# Patient Record
Sex: Female | Born: 1975 | Race: Black or African American | Hispanic: No | Marital: Single | Smoking: Never smoker
Health system: Southern US, Community
[De-identification: ages and names within clinical notes are randomized; demographics above are authoritative.]

## PROBLEM LIST (undated history)

## (undated) DIAGNOSIS — R7303 Prediabetes: Secondary | ICD-10-CM

## (undated) DIAGNOSIS — I10 Essential (primary) hypertension: Secondary | ICD-10-CM

## (undated) DIAGNOSIS — B009 Herpesviral infection, unspecified: Secondary | ICD-10-CM

## (undated) DIAGNOSIS — E559 Vitamin D deficiency, unspecified: Secondary | ICD-10-CM

## (undated) HISTORY — DX: Vitamin D deficiency, unspecified: E55.9

## (undated) HISTORY — DX: Prediabetes: R73.03

## (undated) HISTORY — PX: WISDOM TOOTH EXTRACTION: SHX21

## (undated) HISTORY — DX: Herpesviral infection, unspecified: B00.9

## (undated) HISTORY — DX: Essential (primary) hypertension: I10

---

## 1998-04-18 ENCOUNTER — Encounter: Payer: Self-pay | Admitting: Emergency Medicine

## 1998-04-18 ENCOUNTER — Emergency Department (HOSPITAL_COMMUNITY): Admission: EM | Admit: 1998-04-18 | Discharge: 1998-04-18 | Payer: Self-pay | Admitting: Emergency Medicine

## 1998-12-25 ENCOUNTER — Other Ambulatory Visit: Admission: RE | Admit: 1998-12-25 | Discharge: 1998-12-25 | Payer: Self-pay | Admitting: Family Medicine

## 1998-12-31 ENCOUNTER — Ambulatory Visit (HOSPITAL_COMMUNITY): Admission: RE | Admit: 1998-12-31 | Discharge: 1998-12-31 | Payer: Self-pay | Admitting: Family Medicine

## 1998-12-31 ENCOUNTER — Encounter: Payer: Self-pay | Admitting: Family Medicine

## 2000-04-14 ENCOUNTER — Other Ambulatory Visit: Admission: RE | Admit: 2000-04-14 | Discharge: 2000-04-14 | Payer: Self-pay | Admitting: Obstetrics & Gynecology

## 2000-10-24 ENCOUNTER — Inpatient Hospital Stay (HOSPITAL_COMMUNITY): Admission: AD | Admit: 2000-10-24 | Discharge: 2000-10-24 | Payer: Self-pay | Admitting: Obstetrics and Gynecology

## 2000-10-25 ENCOUNTER — Encounter (HOSPITAL_COMMUNITY): Admission: RE | Admit: 2000-10-25 | Discharge: 2000-11-08 | Payer: Self-pay | Admitting: Obstetrics and Gynecology

## 2000-10-27 ENCOUNTER — Observation Stay (HOSPITAL_COMMUNITY): Admission: AD | Admit: 2000-10-27 | Discharge: 2000-10-28 | Payer: Self-pay | Admitting: Obstetrics and Gynecology

## 2000-11-07 ENCOUNTER — Encounter (INDEPENDENT_AMBULATORY_CARE_PROVIDER_SITE_OTHER): Payer: Self-pay

## 2000-11-07 ENCOUNTER — Inpatient Hospital Stay (HOSPITAL_COMMUNITY): Admission: AD | Admit: 2000-11-07 | Discharge: 2000-11-10 | Payer: Self-pay | Admitting: Obstetrics and Gynecology

## 2000-11-11 ENCOUNTER — Encounter: Admission: RE | Admit: 2000-11-11 | Discharge: 2000-12-11 | Payer: Self-pay | Admitting: Obstetrics and Gynecology

## 2001-08-28 ENCOUNTER — Other Ambulatory Visit: Admission: RE | Admit: 2001-08-28 | Discharge: 2001-08-28 | Payer: Self-pay | Admitting: Obstetrics and Gynecology

## 2003-02-06 ENCOUNTER — Other Ambulatory Visit: Admission: RE | Admit: 2003-02-06 | Discharge: 2003-02-06 | Payer: Self-pay | Admitting: Obstetrics and Gynecology

## 2003-02-06 ENCOUNTER — Other Ambulatory Visit: Admission: RE | Admit: 2003-02-06 | Discharge: 2003-02-06 | Payer: Self-pay | Admitting: *Deleted

## 2004-04-02 ENCOUNTER — Other Ambulatory Visit: Admission: RE | Admit: 2004-04-02 | Discharge: 2004-04-02 | Payer: Self-pay | Admitting: Obstetrics and Gynecology

## 2005-04-06 ENCOUNTER — Other Ambulatory Visit: Admission: RE | Admit: 2005-04-06 | Discharge: 2005-04-06 | Payer: Self-pay | Admitting: Obstetrics and Gynecology

## 2006-04-21 ENCOUNTER — Other Ambulatory Visit: Admission: RE | Admit: 2006-04-21 | Discharge: 2006-04-21 | Payer: Self-pay | Admitting: Obstetrics and Gynecology

## 2007-09-20 ENCOUNTER — Emergency Department (HOSPITAL_COMMUNITY): Admission: EM | Admit: 2007-09-20 | Discharge: 2007-09-20 | Payer: Self-pay | Admitting: Emergency Medicine

## 2009-12-29 ENCOUNTER — Inpatient Hospital Stay (HOSPITAL_COMMUNITY): Admission: EM | Admit: 2009-12-29 | Discharge: 2010-01-02 | Payer: Self-pay | Admitting: Emergency Medicine

## 2010-09-20 LAB — URINALYSIS, ROUTINE W REFLEX MICROSCOPIC
Ketones, ur: >80 mg/dL — AB
Protein, ur: 100 mg/dL — AB
Urobilinogen, UA: 0.2 mg/dL (ref 0.0–1.0)

## 2010-09-20 LAB — CBC
HCT: 32.2 % — ABNORMAL LOW (ref 36.0–46.0)
HCT: 33.7 % — ABNORMAL LOW (ref 36.0–46.0)
Hemoglobin: 10.9 g/dL — ABNORMAL LOW (ref 12.0–15.0)
Hemoglobin: 11.5 g/dL — ABNORMAL LOW (ref 12.0–15.0)
MCH: 27.1 pg (ref 26.0–34.0)
MCH: 27.5 pg (ref 26.0–34.0)
MCHC: 33.2 g/dL (ref 30.0–36.0)
MCHC: 33.9 g/dL (ref 30.0–36.0)
MCV: 80.6 fL (ref 78.0–100.0)
MCV: 81.5 fL (ref 78.0–100.0)
Platelets: 244 10*3/uL (ref 150–400)
Platelets: 247 10*3/uL (ref 150–400)
RBC: 3.95 MIL/uL (ref 3.87–5.11)
RBC: 3.98 MIL/uL (ref 3.87–5.11)
RBC: 4.19 MIL/uL (ref 3.87–5.11)
RBC: 5.21 MIL/uL — ABNORMAL HIGH (ref 3.87–5.11)
RDW: 15.8 % — ABNORMAL HIGH (ref 11.5–15.5)
WBC: 13.1 10*3/uL — ABNORMAL HIGH (ref 4.0–10.5)
WBC: 13.2 10*3/uL — ABNORMAL HIGH (ref 4.0–10.5)

## 2010-09-20 LAB — BASIC METABOLIC PANEL
BUN: 2 mg/dL — ABNORMAL LOW (ref 6–23)
BUN: 7 mg/dL (ref 6–23)
CO2: 23 mEq/L (ref 19–32)
Calcium: 7 mg/dL — ABNORMAL LOW (ref 8.4–10.5)
Calcium: 7.9 mg/dL — ABNORMAL LOW (ref 8.4–10.5)
Chloride: 101 mEq/L (ref 96–112)
Chloride: 105 mEq/L (ref 96–112)
Chloride: 107 mEq/L (ref 96–112)
Creatinine, Ser: 0.86 mg/dL (ref 0.4–1.2)
GFR calc Af Amer: >60 mL/min (ref >60)
GFR calc Af Amer: >60 mL/min (ref >60)
GFR calc non Af Amer: >60 mL/min (ref >60)
GFR calc non Af Amer: >60 mL/min (ref >60)
GFR calc non Af Amer: >60 mL/min (ref >60)
Glucose, Bld: 105 mg/dL — ABNORMAL HIGH (ref 70–99)
Glucose, Bld: 95 mg/dL (ref 70–99)
Potassium: 3.1 mEq/L — ABNORMAL LOW (ref 3.5–5.1)
Potassium: 3.4 mEq/L — ABNORMAL LOW (ref 3.5–5.1)
Sodium: 134 mEq/L — ABNORMAL LOW (ref 135–145)
Sodium: 143 mEq/L (ref 135–145)

## 2010-09-20 LAB — COMPREHENSIVE METABOLIC PANEL
ALT: 12 U/L (ref 0–35)
CO2: 24 mEq/L (ref 19–32)
Calcium: 7.2 mg/dL — ABNORMAL LOW (ref 8.4–10.5)
Chloride: 104 mEq/L (ref 96–112)
GFR calc non Af Amer: >60 mL/min (ref >60)
Glucose, Bld: 112 mg/dL — ABNORMAL HIGH (ref 70–99)
Sodium: 137 mEq/L (ref 135–145)
Total Bilirubin: 0.4 mg/dL (ref 0.3–1.2)

## 2010-09-20 LAB — PROTIME-INR
INR: 1.01 (ref 0.00–1.49)
Prothrombin Time: 13.2 seconds (ref 11.6–15.2)

## 2010-09-20 LAB — OVA AND PARASITE EXAMINATION

## 2010-09-20 LAB — POCT I-STAT, CHEM 8
Calcium, Ion: 1.07 mmol/L — ABNORMAL LOW (ref 1.12–1.32)
Glucose, Bld: 122 mg/dL — ABNORMAL HIGH (ref 70–99)
HCT: 45 % (ref 36.0–46.0)
Hemoglobin: 15.3 g/dL — ABNORMAL HIGH (ref 12.0–15.0)
TCO2: 24 mmol/L (ref 0–100)

## 2010-09-20 LAB — HEPATIC FUNCTION PANEL
Albumin: 3.1 g/dL — ABNORMAL LOW (ref 3.5–5.2)
Total Protein: 6.9 g/dL (ref 6.0–8.3)

## 2010-09-20 LAB — CLOSTRIDIUM DIFFICILE EIA: C difficile Toxins A+B, EIA: NEGATIVE

## 2010-09-20 LAB — STOOL CULTURE

## 2010-09-20 LAB — MAGNESIUM: Magnesium: 1.5 mg/dL (ref 1.5–2.5)

## 2010-09-20 LAB — DIFFERENTIAL
Basophils Relative: 0 % (ref 0–1)
Eosinophils Absolute: 0 10*3/uL (ref 0.0–0.7)
Lymphs Abs: 1.4 10*3/uL (ref 0.7–4.0)
Neutro Abs: 6.1 10*3/uL (ref 1.7–7.7)
Neutrophils Relative %: 74 % (ref 43–77)

## 2010-09-20 LAB — URINE MICROSCOPIC-ADD ON

## 2010-09-20 LAB — GLUCOSE, CAPILLARY: Glucose-Capillary: 93 mg/dL (ref 70–99)

## 2010-09-20 LAB — FECAL LACTOFERRIN, QUANT: Fecal Lactoferrin: POSITIVE

## 2010-09-20 LAB — HEMOCCULT GUIAC POC 1CARD (OFFICE): Fecal Occult Bld: POSITIVE

## 2010-09-20 LAB — LACTATE DEHYDROGENASE: LDH: 140 U/L (ref 94–250)

## 2010-09-20 LAB — CULTURE, BLOOD (ROUTINE X 2)

## 2010-09-20 LAB — GIARDIA/CRYPTOSPORIDIUM SCREEN(EIA)

## 2010-11-20 NOTE — H&P (Signed)
[facility]  Patient:    Regina Jennings, Regina Jennings                    MRN: [id number] Adm. Date:  [id number] Attending:  Shaune Spittle Dictator:   Wynelle Bourgeois, CNM                         History and Physical  HISTORY OF PRESENT ILLNESS:   This is a 35 year old G1, P0 at 38-3/7 weeks, who presents from the office for induction of labor secondary to IUGR.  Her pregnancy has been remarkable for (1) late transfer of care from another practice, (2) mild anemia, (3) ASCUS Pap, (4) IUGR.  PRENATAL LABORATORIES:        Hemoglobin 13.9, platelets 314, blood type B-positive, antibody screen negative, sickle cell negative, thalassemia negative, RPR nonreactive, rubella immune, HBsAg negative, HIV nonreactive, Pap test ASCUS, gonorrhea negative, chlamydia negative, AFP free beta within normal limits normal, glucose challenge within normal limits.  MEDICAL HISTORY:              Remarkable for history of abnormal Pap and history of chlamydia 7-8 years ago.  She also has a history of childhood varicella, questionable hepatitis B vaccine, history of anemia.  FAMILY HISTORY:               Remarkable for a maternal grandmother with hypertension, a maternal grandmother with stroke and mother with migraines.  GENETIC HISTORY:              Remarkable for a maternal aunt and uncle with twins.  SOCIAL HISTORY:               Patient is single, but involved with Regina Jennings, Regina Jennings., who is involved and supportive.  She does not report a religious affiliation.  She denies any alcohol, tobacco or drug use.  PHYSICAL EXAMINATION:  VITAL SIGNS:                  Stable.  HEENT:                        Within normal limits.  Thyroid normal, not                               enlarged.  CHEST:                        Clear to auscultation bilaterally.  CARDIOVASCULAR:               Regular rate and rhythm.  ABDOMEN:                      Gravid, vertex to [place].  Her  fetal monitor denotes fetal heart rate baseline 130-140 with small accelerations, occasional mild uterine contractions.  CERVICAL EXAMINATION:         Closed, 50% effaced and -1 to -2 station with a vertex presentation.  EXTREMITIES:                  Within normal limits.  ASSESSMENT:                   1. Intrauterine pregnancy at 38-3/7 weeks.  2. Intrauterine growth retardation.  PLAN:                         1. Admit to birthing suite per Dr. Pennie Rushing.                               2. Routine M.D. orders.                               3. Induction planned per Dr. Pennie Rushing. DD:  10/27/00 TD:  10/27/00 Job: [id number]

## 2010-11-20 NOTE — Discharge Summary (Signed)
[facility]  Patient:    Regina Jennings, Regina Jennings                    MRN: [id number] Adm. Date:  [id number] Disc. Date: [id number] Attending:  Shaune Spittle Dictator:   Vance Gather Duplantis, C.N.M.                           Discharge Summary  ADMISSION DIAGNOSES:          1. Intrauterine pregnancy at 38-3/7 weeks.                               2. Intrauterine growth restriction.                               3. Unfavorable cervix.  DISCHARGE DIAGNOSES:          1. Intrauterine pregnancy at 38-3/7 weeks.                               2. Intrauterine growth restriction.                               3. Unfavorable cervix.                               4. Failed induction of labor.                               5. Reassuring fetal heart rate tracing.  PROCEDURES THIS ADMISSION:               None.  HOSPITAL COURSE:              Ms. Storti is a 35 year old single, black female, gravida 1, para 0 at 38-3/7 weeks who was admitted for induction of labor secondary to IUGR of less than 3rd percentile and some mild variable-type decelerations noted on NST in the office earlier today on April 25. She was admitted for cervical ripening on the evening of April 25 and was started on Pitocin on April 26. She continued on Pitocin throughout the day of April 26, and contracted but never changed her cervix, and was offered the options of Cytotec and then Pitocin again tonight, a C-section, or discharge to home. The risks and benefits of each option were reviewed by Dr. Stefano Gaul with the patient and her friend, and the patient elected to go home. She is instructed to continue with kick counts and to return on October 31, 2000 for NST.  DISCHARGE INSTRUCTIONS:       The patient is to do fetal kick counts regularly, to call if she notices decreased fetal movement, and any other signs and symptoms of labor.  DISCHARGE LABORATORIES:       None.  DISCHARGE FOLLOWUP:            Followup will be on October 31, 2000 for another NST.  DISCHARGE MEDICATIONS:        The patient is to continue on prenatal vitamins daily. DD:  10/28/00 TD:  10/29/00 Job: [id number]

## 2012-01-15 IMAGING — CT CT ABD-PELV W/ CM
2 of 4 series · 17 of 46 positions shown, 19 images · IV contrast (omnipaque)
Comparison: Plain films 12/29/2009

CLINICAL DATA: Abdominal pain, blood in stool.

CT ABDOMEN AND PELVIS WITH CONTRAST
TECHNIQUE: Multidetector CT imaging of the abdomen and pelvis was
performed following the standard protocol during bolus
administration of intravenous contrast.
Contrast: 90 ml Omnipaque 300 IV.

[Series 2: routine abdomen · axial · 0.64mm/px · z∈[-407,-72]mm · 14 of 75 slices shown, 16 images]
[im 4/75  soft-tissue]
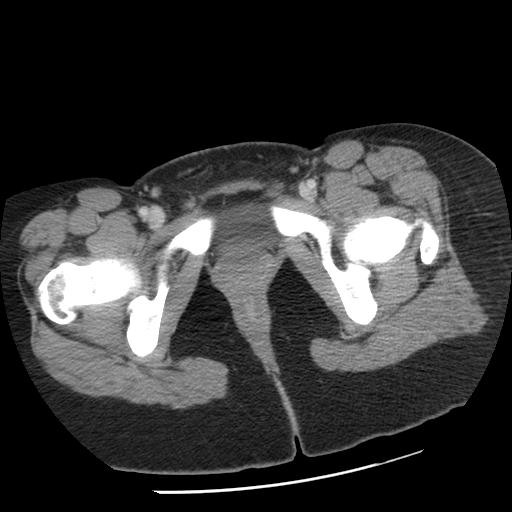
[im 4/75  bone]
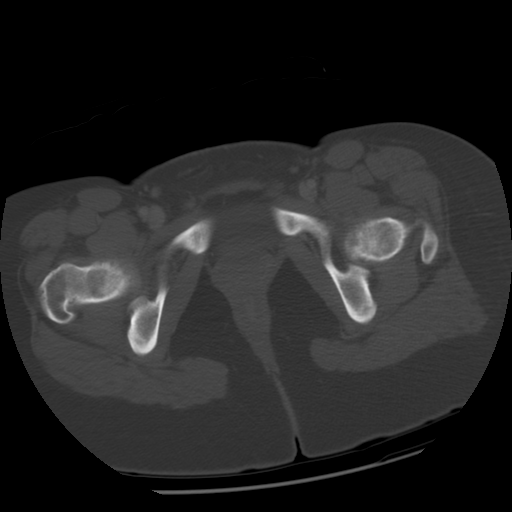
[im 10/75  soft-tissue]
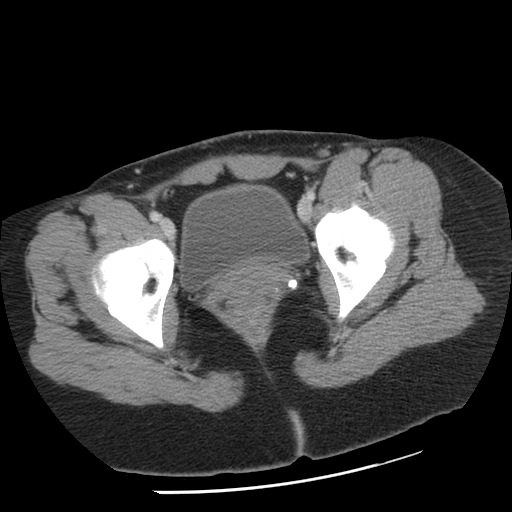
[im 16/75  soft-tissue]
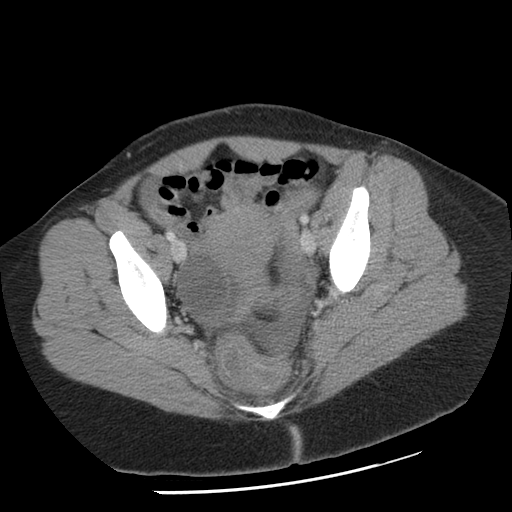
[im 19/75  soft-tissue]
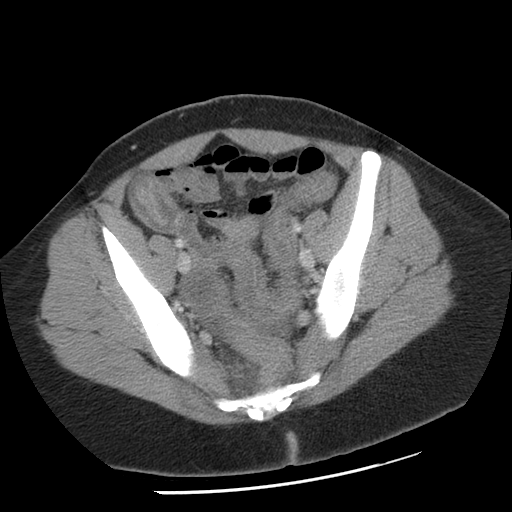
[im 25/75  soft-tissue]
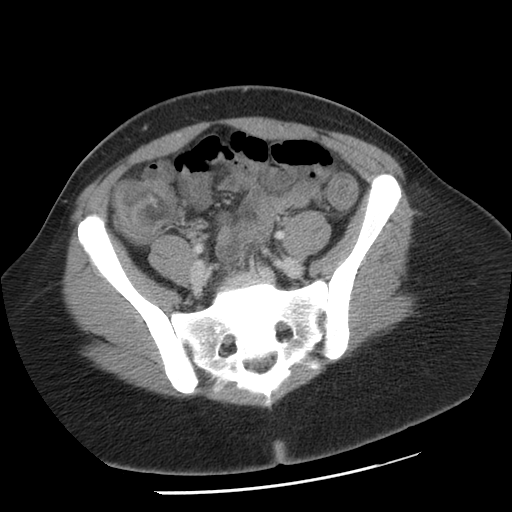
[im 31/75  soft-tissue]
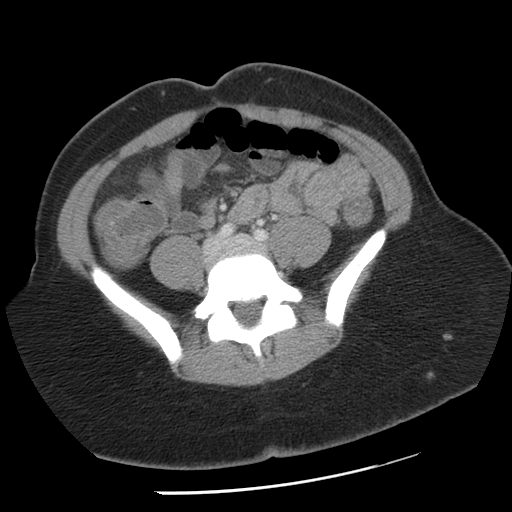
[im 34/75  soft-tissue]
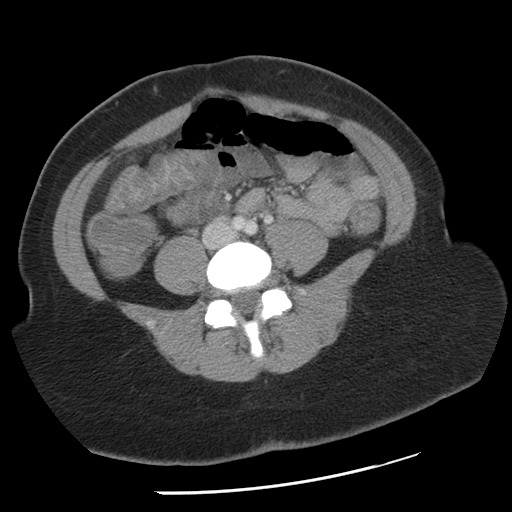
[im 41/75  soft-tissue]
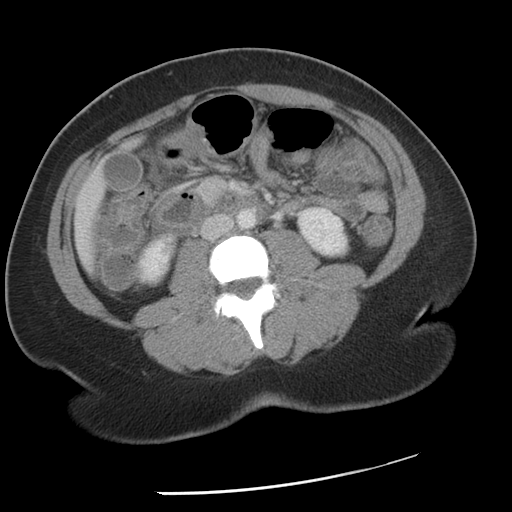
[im 44/75  soft-tissue]
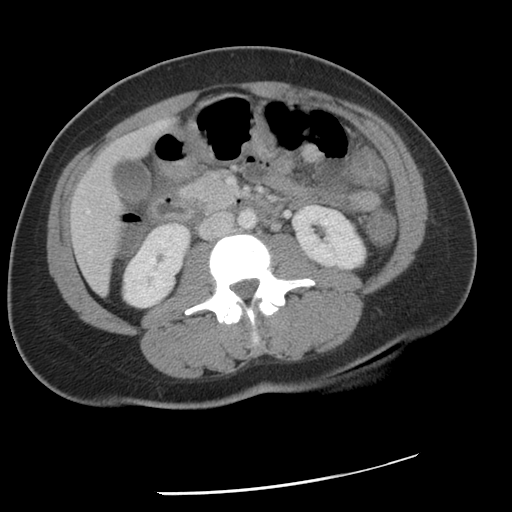
[im 44/75  bone]
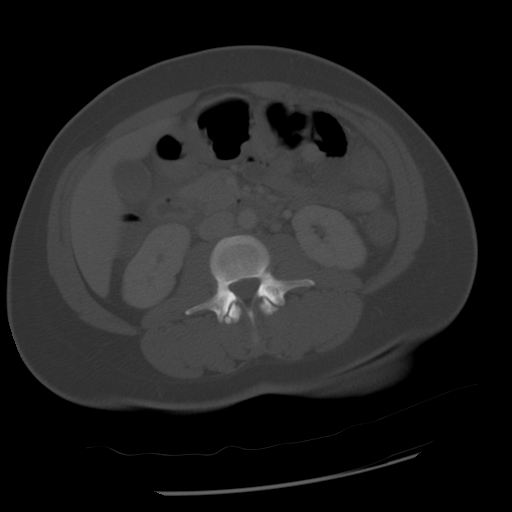
[im 50/75  soft-tissue]
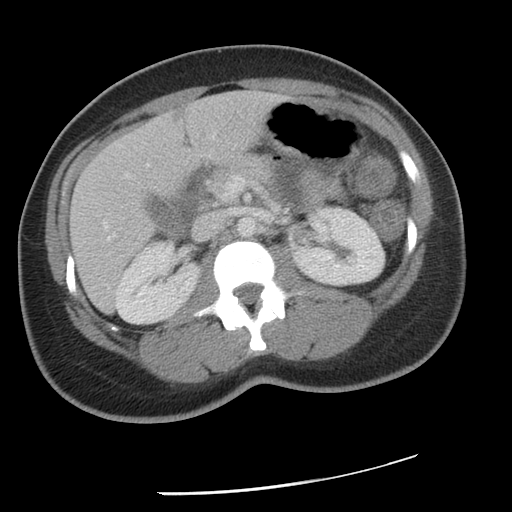
[im 56/75  soft-tissue]
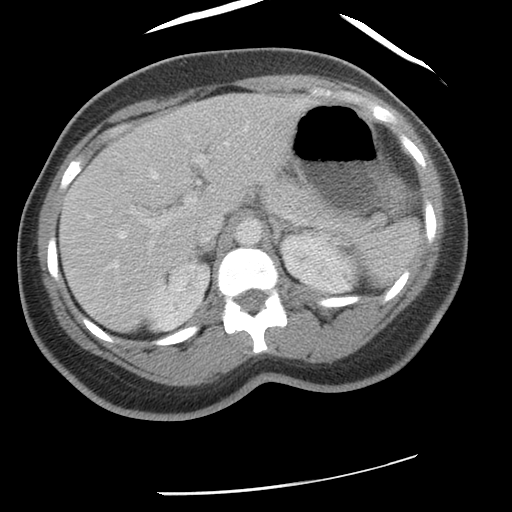
[im 59/75  soft-tissue]
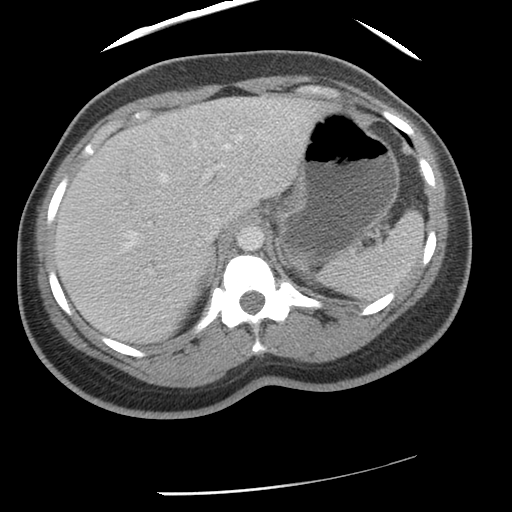
[im 65/75  soft-tissue]
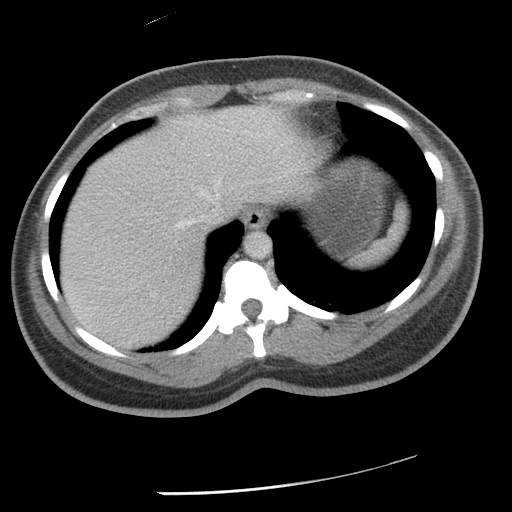
[im 71/75  soft-tissue]
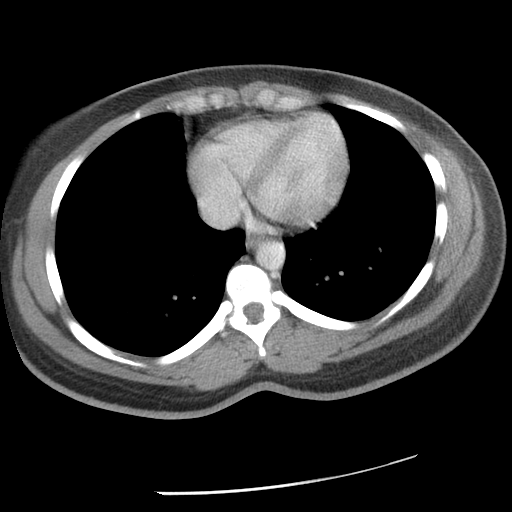

[Series 401: cor · coronal · 0.83mm/px · 3 of 103 slices shown]
[im 35/103  soft-tissue]
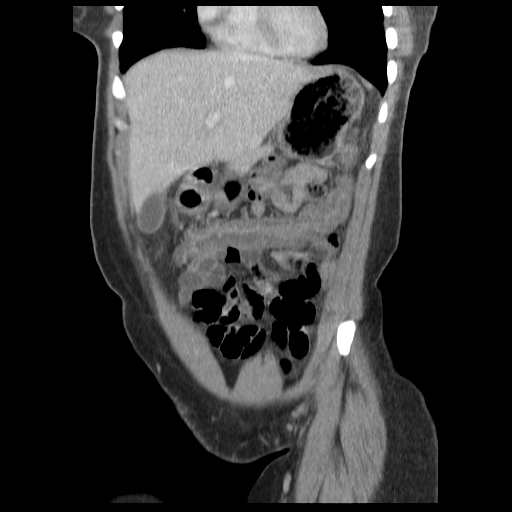
[im 46/103  soft-tissue]
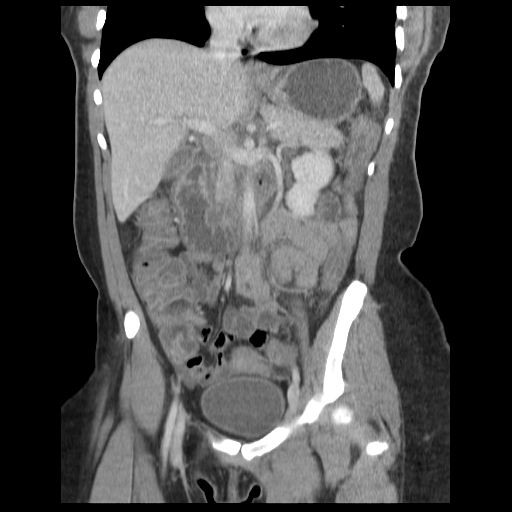
[im 57/103  soft-tissue]
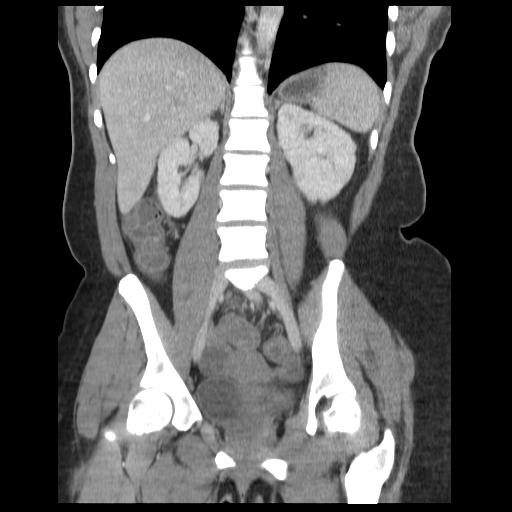

[17 of 46 positions shown; findings below may reference images not displayed]

FINDINGS: Lung bases are clear.  No effusions.  Heart is normal
size.

Liver, spleen, pancreas, gallbladder, adrenals and kidneys are
unremarkable.

There is diffuse colonic wall thickening with surrounding edema and
small amount of free fluid in the pelvis.  In addition, distal
stomach and proximal duodenum are thickened and edematous.  Small
bowel is decompressed.  No evidence of obstruction.

Uterus and left ovary are unremarkable.  3.5 cm cyst noted in the
right ovary.  Aorta is normal caliber.
IMPRESSION: Diffuse colonic wall thickening compatible with pancolitis.

Mild wall thickening in the distal stomach and proximal duodenum
compatible with gastritis/duodenitis.

3.5 cm right ovarian cyst.

Small amount of free fluid the pelvis.

## 2012-05-24 ENCOUNTER — Telehealth: Payer: Self-pay | Admitting: Obstetrics and Gynecology

## 2012-05-24 NOTE — Telephone Encounter (Signed)
TC TO PT REGARDING MESSAGE. PT STATES THAT SHE IS HAVING VAGINAL ITCHING AND WANT TO EVAL. PT STATES SHE IS HAVING WHITE SPOTS ON LEGS AND WANT TO KNOW WHAT THEY ARE.SCHEDULED PT AN APPT FOR 05/26/12 TO SEE EP. PT VOICED UNDERSTANDING.

## 2012-05-26 ENCOUNTER — Encounter: Payer: Self-pay | Admitting: Obstetrics and Gynecology

## 2012-05-26 ENCOUNTER — Ambulatory Visit (INDEPENDENT_AMBULATORY_CARE_PROVIDER_SITE_OTHER): Payer: BC Managed Care – PPO | Admitting: Obstetrics and Gynecology

## 2012-05-26 VITALS — BP 140/88 | Temp 98.8°F | Wt 176.0 lb

## 2012-05-26 DIAGNOSIS — Z309 Encounter for contraceptive management, unspecified: Secondary | ICD-10-CM

## 2012-05-26 DIAGNOSIS — R102 Pelvic and perineal pain: Secondary | ICD-10-CM

## 2012-05-26 DIAGNOSIS — L293 Anogenital pruritus, unspecified: Secondary | ICD-10-CM

## 2012-05-26 DIAGNOSIS — N949 Unspecified condition associated with female genital organs and menstrual cycle: Secondary | ICD-10-CM

## 2012-05-26 DIAGNOSIS — R21 Rash and other nonspecific skin eruption: Secondary | ICD-10-CM

## 2012-05-26 DIAGNOSIS — N898 Other specified noninflammatory disorders of vagina: Secondary | ICD-10-CM

## 2012-05-26 LAB — POCT URINALYSIS DIPSTICK
Blood, UA: NEGATIVE
Nitrite, UA: NEGATIVE
Spec Grav, UA: <=1.005
Whiff Test: NEGATIVE
pH: 7

## 2012-05-26 LAB — POCT URINE PREGNANCY: Preg Test, Ur: NEGATIVE

## 2012-05-26 MED ORDER — FLUCONAZOLE 150 MG PO TABS: 150.0000 mg | ORAL_TABLET | Freq: Once | ORAL | Status: AC

## 2012-05-26 MED ORDER — CLOTRIMAZOLE-BETAMETHASONE 1-0.05 % EX CREA: TOPICAL_CREAM | Freq: Two times a day (BID) | CUTANEOUS | Status: DC

## 2012-05-26 NOTE — Progress Notes (Signed)
36 YO complaining of pelvic pain, vaginal itching and white spots on her legs.  She refuses exam and is unable to urinate.  Will only evaluate white spots on legs. States that one spot has been present for years without any change but has noticed more over the past several months. Menstrual-like cramping for the past several days but none today (expecting period next week and missed 2 pill earlier this week) has begun to bleed.  Is on Jolessa (3 month continuous pills).  Lastly has been having vaginal itching for the past 2 weeks but has diminished since bleeding has started.  Denies any discharge before she began to bleed. Admits to change in soap/detergent  but not toilet paper, underwear or perineal hygiene. [menses: 7 day flow with pad change 4 times a day, with 5/10 cramps].   O: Thighs:  several random 1 mm depigmented areas on each thigh;  no inflammation or irregularity in shape.  U/A- negative  (able to void prior to leaving visit) UPT-negative  A: Thigh Rash-Depigmentation     Vulvitis Symptoms     Pelvic Cramping ? due to bleeding/missed pills  P: Lotrisone Cream 15 grams topically bid x 7-14 days to affected area no refills       Diflucan 150 mg #1 1 po stat no refills       RTO-as scheduled, prn or if symptoms persist with the understanding that an exam would need to be done  Trystyn Sitts, PA-C

## 2012-05-26 NOTE — Progress Notes (Signed)
Vaginal discharge: frank blood Itching / Burning: yes Fever: no  Symptoms have been present for 9 days. Has used over-the-counter treatment: yes PT USED VAGICAINE Associated symptoms:  Pelvic pain: yes CRAMPING THAT STARTED X 2 DAYS AGO       Dyspareunia: no     Odor:  no  History of STD:  HERPES STD screen:declined  PT ALSO STATES SHE HAVE WHITE SPOTS ON LEGS AND WANT TO KNOW WHAT THEY ARE.

## 2012-06-05 ENCOUNTER — Telehealth: Payer: Self-pay

## 2012-06-05 ENCOUNTER — Other Ambulatory Visit: Payer: Self-pay

## 2012-06-05 ENCOUNTER — Telehealth: Payer: Self-pay | Admitting: Obstetrics and Gynecology

## 2012-06-05 MED ORDER — LEVONORGEST-ETH ESTRAD 91-DAY 0.15-0.03 MG PO TABS: 1.0000 | ORAL_TABLET | Freq: Every day | ORAL | Status: DC

## 2012-06-05 NOTE — Telephone Encounter (Signed)
Advised pt that rx for bc will be called into [facility] [address] with enough refills to last until next annual scheduled 07/11/12 Pt voiced understanding  Regina Jennings, [employer]

## 2012-06-05 NOTE — Telephone Encounter (Signed)
Spoke with pharmacy.  Stated they didn't have any record of refill.  Gave refill that HD did originally over the phone.  ld

## 2012-06-05 NOTE — Telephone Encounter (Signed)
Pt left VM stating that Pharmacy [facility] [address] states that they still have not received [facility] Jolessa.  Called [facility] to verify states that they have received rx Advised pt  Darien Ramus, CMA

## 2012-07-06 ENCOUNTER — Encounter: Payer: Self-pay | Admitting: Obstetrics and Gynecology

## 2012-07-11 ENCOUNTER — Encounter: Payer: Self-pay | Admitting: Obstetrics and Gynecology

## 2012-07-11 ENCOUNTER — Ambulatory Visit (INDEPENDENT_AMBULATORY_CARE_PROVIDER_SITE_OTHER): Payer: BC Managed Care – PPO | Admitting: Obstetrics and Gynecology

## 2012-07-11 VITALS — BP 120/78 | HR 82 | Ht 63.5 in | Wt 174.0 lb

## 2012-07-11 DIAGNOSIS — Z01419 Encounter for gynecological examination (general) (routine) without abnormal findings: Secondary | ICD-10-CM

## 2012-07-11 DIAGNOSIS — Z124 Encounter for screening for malignant neoplasm of cervix: Secondary | ICD-10-CM

## 2012-07-11 DIAGNOSIS — Z113 Encounter for screening for infections with a predominantly sexual mode of transmission: Secondary | ICD-10-CM

## 2012-07-11 MED ORDER — LEVONORGEST-ETH ESTRAD 91-DAY 0.15-0.03 MG PO TABS: 1.0000 | ORAL_TABLET | Freq: Every day | ORAL | Status: DC

## 2012-07-11 NOTE — Patient Instructions (Signed)
To develop good sleep habits:    Go to bed and get up  at the same time each day (even on your days off)  Avoid caffeine, alcohol or nicotine at least 4-6 hours before bedtime  If you haven't fallen asleep within 15 minutes of getting in the bed, get up and do something non-stimulating  until you feel sleepy again,  then return to bed.  Only try to sleep when you are actually sleepy.  Do not watch TV, read, write, play games or talk on the phone in bed.  Only use the bed for sleep and sex  Do not nap or remain  in the bed if you are awake  Do not go to bed too  hungry or  too full   Develop a routine prior to bedtime so that your body will get a signal that bedtime is near  Do not do anything stimulating before bedtime  Make sure that your bedroom is comfortable for sleeping

## 2012-07-11 NOTE — Progress Notes (Signed)
Regular Periods: yes Mammogram: yes  Monthly Breast Ex.: no Exercise: yes  Tetanus < 10 years: no Seatbelts: yes  NI. Bladder Functn.: yes Abuse at home: no  Daily BM's: yes Stressful Work: no  Healthy Diet: yes Sigmoid-Colonoscopy: NO  Calcium: yes Medical problems this year: LOW ENERGY; QUESTION ABOUT MVT AND SLEEP PROBLEMS   LAST ZOX:[id number]  Contraception: JOLESSA  Mammogram:  LONG TIME AGO  PCP: DR. SANDERS  PMH: NO CHANGE  FMH: NO CHANGE  Last Bone Scan: ?  PT IS SINGLE

## 2012-07-11 NOTE — Progress Notes (Signed)
Subjective:    Regina Jennings is a 37 y.o. female, G1P0, who presents for an annual exam. The patient reports fatigue and insomnia.  Has early morning awakening (4 a.m.) for the past 4 weeks (was on vacation).    Menstrual cycle:   LMP: Patient's last menstrual period was 06/03/2012.             Review of Systems Pertinent items are noted in HPI. Denies pelvic pain, urinary tract symptoms, vaginitis symptoms, irregular bleeding, menopausal symptoms, change in bowel habits or rectal bleeding   Objective:    BP 120/78  Pulse 82  Ht 5' 3.5" (1.613 m)  Wt 174 lb (78.926 kg)  BMI 30.34 kg/m2  LMP 06/03/2012     Wt Readings from Last 1 Encounters:  07/11/12 174 lb (78.926 kg)   Body mass index is 30.34 kg/(m^2). General Appearance: Alert, no acute distress HEENT: Grossly normal Neck / Thyroid: Supple, no thyromegaly or cervical adenopathy Lungs: Clear to auscultation bilaterally Back: No CVA tenderness Breast Exam: No masses or nodes.No dimpling, nipple retraction or discharge. Cardiovascular: Regular rate and rhythm.  Gastrointestinal: Soft, non-tender, no masses or organomegaly Pelvic Exam: EGBUS-wnl, vagina-normal rugae, cervix- without lesions or tenderness, uterus appears normal size shape and consistency, adnexae-no masses or tenderness Lymphatic Exam: Non-palpable nodes in neck, clavicular,  axillary, or inguinal regions  Skin: no rashes or abnormalities Extremities: no clubbing cyanosis or edema  Neurologic: grossly normal Psychiatric: Alert and oriented   Assessment:   Routine GYN Exam New Insomnia   Plan:  Sleep Hygiene   F/U with Dr. Allyne Gee  PAP sent  STD testing  RTO 1 year or prn  Regina Jennings,ELMIRAPA-C

## 2012-07-12 LAB — PAP IG, CT-NG, RFX HPV ASCU: GC Probe Amp: NEGATIVE

## 2012-07-12 LAB — RPR

## 2012-07-13 ENCOUNTER — Telehealth: Payer: Self-pay | Admitting: Obstetrics and Gynecology

## 2012-07-17 NOTE — Telephone Encounter (Signed)
LVM for return call  Chayden Garrelts, CMA  

## 2012-08-16 NOTE — Telephone Encounter (Signed)
No return call received from pt. 

## 2013-01-10 ENCOUNTER — Encounter (HOSPITAL_COMMUNITY): Payer: Self-pay | Admitting: Family Medicine

## 2013-01-10 ENCOUNTER — Emergency Department (HOSPITAL_COMMUNITY)
Admission: EM | Admit: 2013-01-10 | Discharge: 2013-01-10 | Disposition: A | Discharge status: Home / Self Care | Payer: BC Managed Care – PPO | Attending: Emergency Medicine | Admitting: Emergency Medicine

## 2013-01-10 ENCOUNTER — Emergency Department (HOSPITAL_COMMUNITY)
Admission: EM | Admit: 2013-01-10 | Discharge: 2013-01-10 | Disposition: A | Discharge status: Home / Self Care | Payer: BC Managed Care – PPO | Source: Home / Self Care | Attending: Family Medicine | Admitting: Family Medicine

## 2013-01-10 ENCOUNTER — Emergency Department (HOSPITAL_COMMUNITY): Payer: BC Managed Care – PPO

## 2013-01-10 ENCOUNTER — Encounter (HOSPITAL_COMMUNITY): Payer: Self-pay | Admitting: Emergency Medicine

## 2013-01-10 DIAGNOSIS — Z882 Allergy status to sulfonamides status: Secondary | ICD-10-CM | POA: Insufficient documentation

## 2013-01-10 DIAGNOSIS — I10 Essential (primary) hypertension: Secondary | ICD-10-CM | POA: Insufficient documentation

## 2013-01-10 DIAGNOSIS — Z8619 Personal history of other infectious and parasitic diseases: Secondary | ICD-10-CM | POA: Insufficient documentation

## 2013-01-10 DIAGNOSIS — R091 Pleurisy: Secondary | ICD-10-CM

## 2013-01-10 DIAGNOSIS — M546 Pain in thoracic spine: Secondary | ICD-10-CM | POA: Insufficient documentation

## 2013-01-10 DIAGNOSIS — Z888 Allergy status to other drugs, medicaments and biological substances status: Secondary | ICD-10-CM | POA: Insufficient documentation

## 2013-01-10 DIAGNOSIS — R071 Chest pain on breathing: Secondary | ICD-10-CM

## 2013-01-10 DIAGNOSIS — J90 Pleural effusion, not elsewhere classified: Secondary | ICD-10-CM | POA: Insufficient documentation

## 2013-01-10 DIAGNOSIS — E559 Vitamin D deficiency, unspecified: Secondary | ICD-10-CM | POA: Insufficient documentation

## 2013-01-10 DIAGNOSIS — Z79899 Other long term (current) drug therapy: Secondary | ICD-10-CM | POA: Insufficient documentation

## 2013-01-10 LAB — CBC
Hemoglobin: 12.5 g/dL (ref 12.0–15.0)
MCH: 26.2 pg (ref 26.0–34.0)
Platelets: 258 10*3/uL (ref 150–400)
RBC: 4.78 MIL/uL (ref 3.87–5.11)
WBC: 6.2 10*3/uL (ref 4.0–10.5)

## 2013-01-10 LAB — BASIC METABOLIC PANEL
CO2: 26 mEq/L (ref 19–32)
Chloride: 103 mEq/L (ref 96–112)
Glucose, Bld: 82 mg/dL (ref 70–99)
Sodium: 138 mEq/L (ref 135–145)

## 2013-01-10 LAB — D-DIMER, QUANTITATIVE: D-Dimer, Quant: <0.27 ug/mL-FEU (ref 0.00–0.48)

## 2013-01-10 LAB — POCT I-STAT TROPONIN I: Troponin i, poc: 0 ng/mL (ref 0.00–0.08)

## 2013-01-10 MED ORDER — IBUPROFEN 800 MG PO TABS: 800.0000 mg | ORAL_TABLET | Freq: Three times a day (TID) | ORAL | Status: DC

## 2013-01-10 MED ORDER — MORPHINE SULFATE 4 MG/ML IJ SOLN
4.0000 mg | Freq: Once | INTRAMUSCULAR | Status: AC
  Administered 2013-01-10: 4 mg via INTRAVENOUS
  Filled 2013-01-10: qty 1

## 2013-01-10 MED ORDER — HYDROCODONE-ACETAMINOPHEN 5-325 MG PO TABS: 1.0000 | ORAL_TABLET | ORAL | Status: DC | PRN

## 2013-01-10 MED ORDER — ONDANSETRON HCL 4 MG/2ML IJ SOLN
4.0000 mg | Freq: Once | INTRAMUSCULAR | Status: AC
  Administered 2013-01-10: 4 mg via INTRAVENOUS
  Filled 2013-01-10: qty 2

## 2013-01-10 NOTE — ED Notes (Signed)
MD at bedside. 

## 2013-01-10 NOTE — ED Notes (Signed)
Pt had EKG done at [facility]

## 2013-01-10 NOTE — ED Notes (Addendum)
Patient is refusing to have EKG done. EDP made aware.

## 2013-01-10 NOTE — ED Notes (Signed)
Per pt sts sharp pain in her left upper back radiating into her chest. sts worse with deep breath. Pt sent here from [facility] for further eval. Denies cough, fever, SOB. Denies injury

## 2013-01-10 NOTE — ED Notes (Signed)
Emily West, PA at the bedside.  

## 2013-01-10 NOTE — ED Provider Notes (Signed)
History    CSN: [id number] Arrival date & time 01/10/13  1001  First MD Initiated Contact with Patient 01/10/13 1025     Chief Complaint  Patient presents with  . Back Pain  . Chest Pain   (Consider location/radiation/quality/duration/timing/severity/associated sxs/prior Treatment) HPI Comments: Patient reports sudden onset of left upper back pain with radiation through her chest that began this morning while at work.  States pain is exacerbated by deep inspiration.  Pain is described as sharp and severe.  Pt took two aleve pills this morning with some improvement.  Denies fevers, chills, SOB, cough, hemoptysis.  No recent illness.  No recent travel or immobilization.  No peripheral edema.  Pt is on oral contraceptives.  No family or personal hx of blood clots.    The history is provided by the patient.   Past Medical History  Diagnosis Date  . 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 Vitamin D deficiency   . Hypertension    Past Surgical History  Procedure Laterality Date  . Wisdom tooth extraction     Family History  Problem Relation Age of Onset  . Stroke Maternal Grandmother   . Migraines Mother   . Ulcers Mother   . Hypertension Mother    History  Substance Use Topics  . Smoking status: Never Smoker   . Smokeless tobacco: Never Used  . Alcohol Use: Yes   OB History   Grav Para Term Preterm Abortions TAB SAB Ect Mult Living   1         1     Review of Systems  Constitutional: Negative for fever and chills.  HENT: Negative for sore throat and trouble swallowing.   Respiratory: Negative for cough and shortness of breath.   Cardiovascular: Positive for chest pain. Negative for palpitations and leg swelling.  Gastrointestinal: Negative for nausea, vomiting, abdominal pain and diarrhea.  Genitourinary: Negative for dysuria, urgency, frequency, vaginal bleeding and vaginal discharge.  Musculoskeletal: Positive for back pain.  Skin: Negative for  rash.  Neurological: Negative for dizziness and light-headedness.    Allergies  Boric acid; Sulfa antibiotics; and Xyzal  Home Medications   Current Outpatient Rx  Name  Route  Sig  Dispense  Refill  . levonorgestrel-ethinyl estradiol (SEASONALE,INTROVALE,JOLESSA) 0.15-0.03 MG tablet   Oral   Take 1 tablet by mouth daily.   1 Package   11   . triamterene-hydrochlorothiazide (DYAZIDE) 37.5-25 MG per capsule   Oral   Take 1 capsule by mouth every morning.          BP 141/86  Pulse 60  Temp(Src) 98.3 F (36.8 C)  Resp 18  SpO2 99%  LMP 11/23/2012 Physical Exam  Nursing note and vitals reviewed. Constitutional: She appears well-developed and well-nourished. No distress.  HENT:  Head: Normocephalic and atraumatic.  Neck: Neck supple.  Cardiovascular: Normal rate and regular rhythm.   Pulmonary/Chest: Effort normal and breath sounds normal. No respiratory distress. She has no wheezes. She has no rales. She exhibits no tenderness.  Abdominal: Soft. She exhibits no distension. There is no tenderness. There is no rebound and no guarding.  Musculoskeletal:  Left upper back nontender.  Neurological: She is alert.  Skin: She is not diaphoretic.    ED Course  Procedures (including critical care time) Labs Reviewed  CBC - Abnormal; Notable for the following:    MCV 76.4 (*)    All other components within normal limits  BASIC METABOLIC  PANEL - Abnormal; Notable for the following:    GFR calc non Af Amer 74 (*)    GFR calc Af Amer 85 (*)    All other components within normal limits  D-DIMER, QUANTITATIVE  POCT I-STAT TROPONIN I   Dg Chest 2 View  01/10/2013   *RADIOLOGY REPORT*  Clinical Data: Back and chest pain.  CHEST - 2 VIEW  Comparison: 09/20/2007.  Findings: No infiltrate, congestive heart failure or pneumothorax. Heart size within normal limits.  Minimal curvature of the thoracic and lumbar spine convex to the left.  IMPRESSION: Minimal curvature of the thoracic and  lumbar spine otherwise negative exam.   Original Report Authenticated By: Lacy Duverney, M.D.   11:26 AM Joint decision making with patient:  Discussed risks of blood clot and possibility of going directly to CT angio chest vs d-dimer.  Pt declines CT due to radiation but agrees to have CT done if d-dimer is positive.    1. Pleurisy     MDM  Pt with sudden onset left upper back pain with radiation into chest with deep inspiration presents to ED from urgent care, concern for PE.  Pt does not have SOB or cough or hemoptysis, has not had swelling of lower extremities. Only risk factor for PE is oral contraceptives.  Pt declined CT angio after discussion.  D-Dimer is negative.  EKG done at urgent care is unremarkable.  Pt with apparent pleurisy, unknown cause.  Pt given information about pleurisy, advised to follow up with PCP. Advised deep inspiration and cough throughout the day.  D/C home with pain medication.  Discussed all results with patient.  Pt given return precautions.  Pt verbalizes understanding and agrees with plan.     Trixie Dredge, PA-C 01/10/13 1428

## 2013-01-10 NOTE — ED Notes (Addendum)
Pt c/o chest pain radiating to her back. Onset this morning. This has happened before. No changes in activity, sudden onset. Hurts to take a deep breath. Took two aleeve about 20 mins ago. Patient is alert and oriented.

## 2013-01-10 NOTE — ED Provider Notes (Signed)
History    CSN: [id number] Arrival date & time 01/10/13  4098  First MD Initiated Contact with Patient 01/10/13 [phone]     Chief Complaint  Patient presents with  . Chest Pain   (Consider location/radiation/quality/duration/timing/severity/associated sxs/prior Treatment) HPI Comments: 37 year old female with history of hypertension. Here complaining of sudden onset of left-sided chest pain that radiates to the back and worsens with deep inspirations. States she fells like her "back and chest tightens in the left side with taking a deep breath or moving". Pain started at work around 8 AM this morning and has been constant since. Not associated with diaphoresis or nausea. No dizziness or syncope. No cough or congestion. No shortness of breath. No fever or chills. Denies recent travel, personal or family history of blood clot disorders. Patient does take oral contraceptives. No leg swelling. Has not taken her blood pressure medication this morning. Denies abdominal pain, vomiting or diarrhea. Denies recent changes in her physical activity. 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 no history of diabetes or cholesterol abnormalities.   Past Medical History  Diagnosis Date  . 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 Vitamin D deficiency   . Hypertension    Past Surgical History  Procedure Laterality Date  . Wisdom tooth extraction     Family History  Problem Relation Age of Onset  . Stroke Maternal Grandmother   . Migraines Mother   . Ulcers Mother   . Hypertension Mother    History  Substance Use Topics  . Smoking status: Never Smoker   . Smokeless tobacco: Never Used  . Alcohol Use: Yes   OB History   Grav Para Term Preterm Abortions TAB SAB Ect Mult Living   1         1     Review of Systems  Constitutional: Negative for fever, chills, diaphoresis, activity change, appetite change and fatigue.  HENT: Negative for congestion and  sore throat.   Respiratory: Positive for chest tightness. Negative for cough, shortness of breath and wheezing.   Cardiovascular: Positive for chest pain. Negative for palpitations and leg swelling.  Gastrointestinal: Negative for nausea, vomiting, abdominal pain and diarrhea.  Musculoskeletal: Positive for back pain.  Skin: Negative for rash.  Neurological: Negative for dizziness and headaches.  All other systems reviewed and are negative.    Allergies  Boric acid; Sulfa antibiotics; and Xyzal  Home Medications   Current Outpatient Rx  Name  Route  Sig  Dispense  Refill  . levonorgestrel-ethinyl estradiol (SEASONALE,INTROVALE,JOLESSA) 0.15-0.03 MG tablet   Oral   Take 1 tablet by mouth daily.   1 Package   11   . triamterene-hydrochlorothiazide (DYAZIDE) 37.5-25 MG per capsule   Oral   Take 1 capsule by mouth every morning.          BP 167/93  Pulse 83  Temp(Src) 98.2 F (36.8 C) (Oral)  Resp 14  SpO2 97%  LMP 11/23/2012 Physical Exam  Nursing note and vitals reviewed. Constitutional: She is oriented to person, place, and time. She appears well-developed and well-nourished.  Uncomfortable with any movement.   HENT:  Head: Normocephalic and atraumatic.  Mouth/Throat: Oropharynx is clear and moist. No oropharyngeal exudate.  Eyes: Conjunctivae and EOM are normal. Pupils are equal, round, and reactive to light. No scleral icterus.  Neck: Neck supple. No JVD present. No thyromegaly present.  Cardiovascular: Normal rate, regular rhythm, normal heart  sounds and intact distal pulses.  Exam reveals no gallop and no friction rub.   No murmur heard. No low extremity edema  Pulmonary/Chest: No respiratory distress. She has no wheezes. She has no rales. She exhibits no tenderness.  Poor effort due to pain. Shallow breathing but impress clear breath sounds bilaterally. No tachypnea or orthopnea.  Abdominal: Soft. She exhibits no distension. There is no tenderness.   Lymphadenopathy:    She has no cervical adenopathy.  Neurological: She is alert and oriented to person, place, and time.  Skin: She is not diaphoretic.    ED Course  Procedures (including critical care time) Labs Reviewed - No data to display No results found. 1. Chest pain on breathing   EKG: Sinus rhythm with a ventricular rate 68 beats per minute flattening of the T waves in most precordial leads. No ST or other acute ischemic changes.  MDM  37 year old female with history of hypertension. Here complaining of sudden onset of left-sided chest pain that radiates to the back and worsens with movement and deep inspirations. Pain started at work around 8 AM this morning and has been constant since. Not associated with diaphoresis or nausea. No dizziness or syncope. On exam: Vital signs mildly hypertensive with a oxygen saturation at 97% otherwise no tachycardic or tachypneic. Patient uncomfortable with movement grabbing her chest. Impress normal heart and breath sounds throughout. No JVD no low extremity edema.  Patient has taken #2 Aleve tablets per her arrival. No medications has been administered at [facility]. No image study has been performed prior transfer. Decided to transfer to the emergency department for further evaluation and management.   Sharin Grave, MD 01/10/13 [phone]

## 2013-01-10 NOTE — ED Notes (Signed)
Pt. Just returned from X-ray 

## 2013-01-14 NOTE — ED Provider Notes (Signed)
Medical screening examination/treatment/procedure(s) were performed by non-physician practitioner and as supervising physician I was immediately available for consultation/collaboration.  Suzy Kugel T Kaniya Trueheart, MD 01/14/13 0835 

## 2014-05-06 ENCOUNTER — Encounter (HOSPITAL_COMMUNITY): Payer: Self-pay | Admitting: Family Medicine

## 2015-01-27 IMAGING — CR DG CHEST 2V
2 series · 2 of 2 positions shown · non-contrast
Comparison: 09/20/2007.

CLINICAL DATA: Back and chest pain.

CHEST - 2 VIEW

[w chest pa]
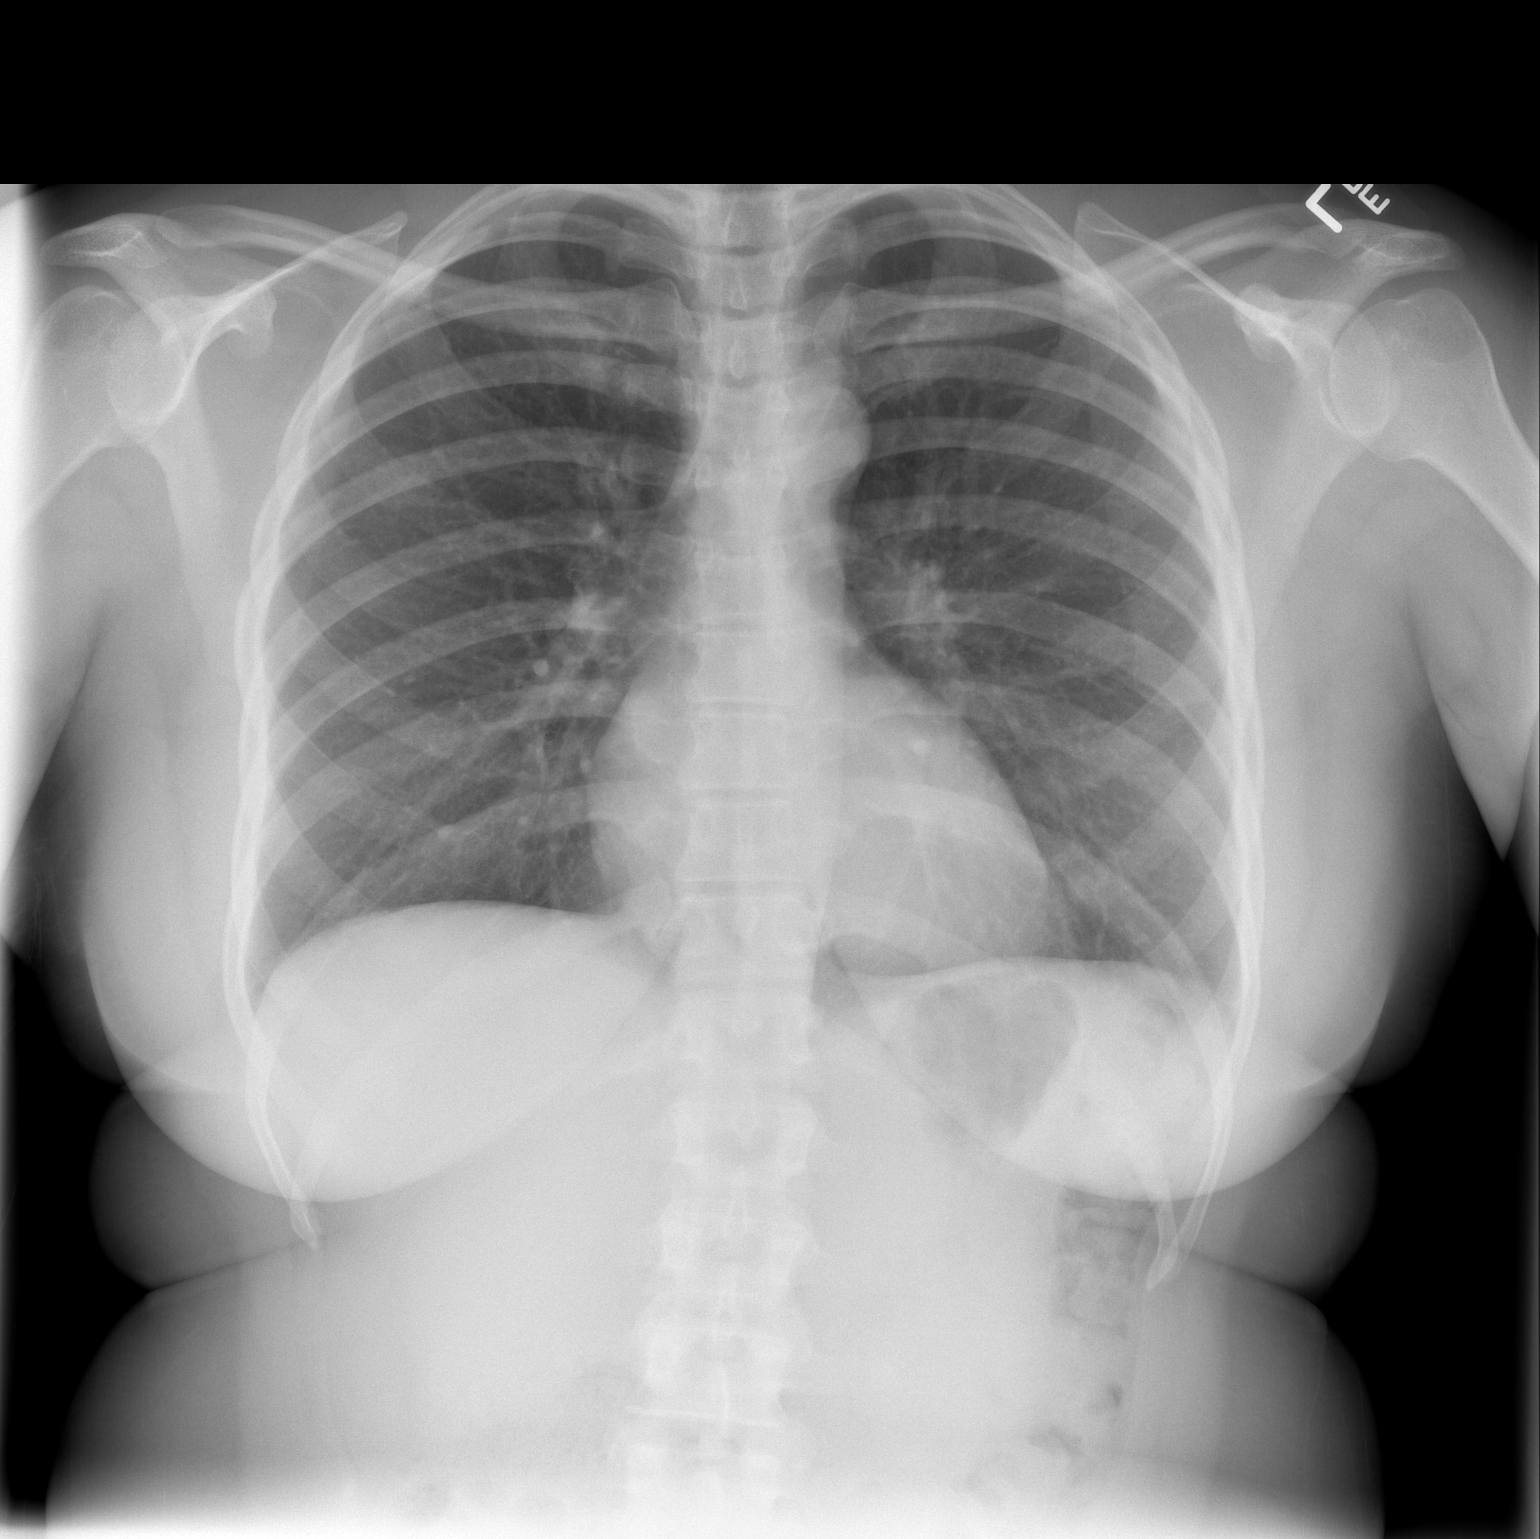

[w chest lat]
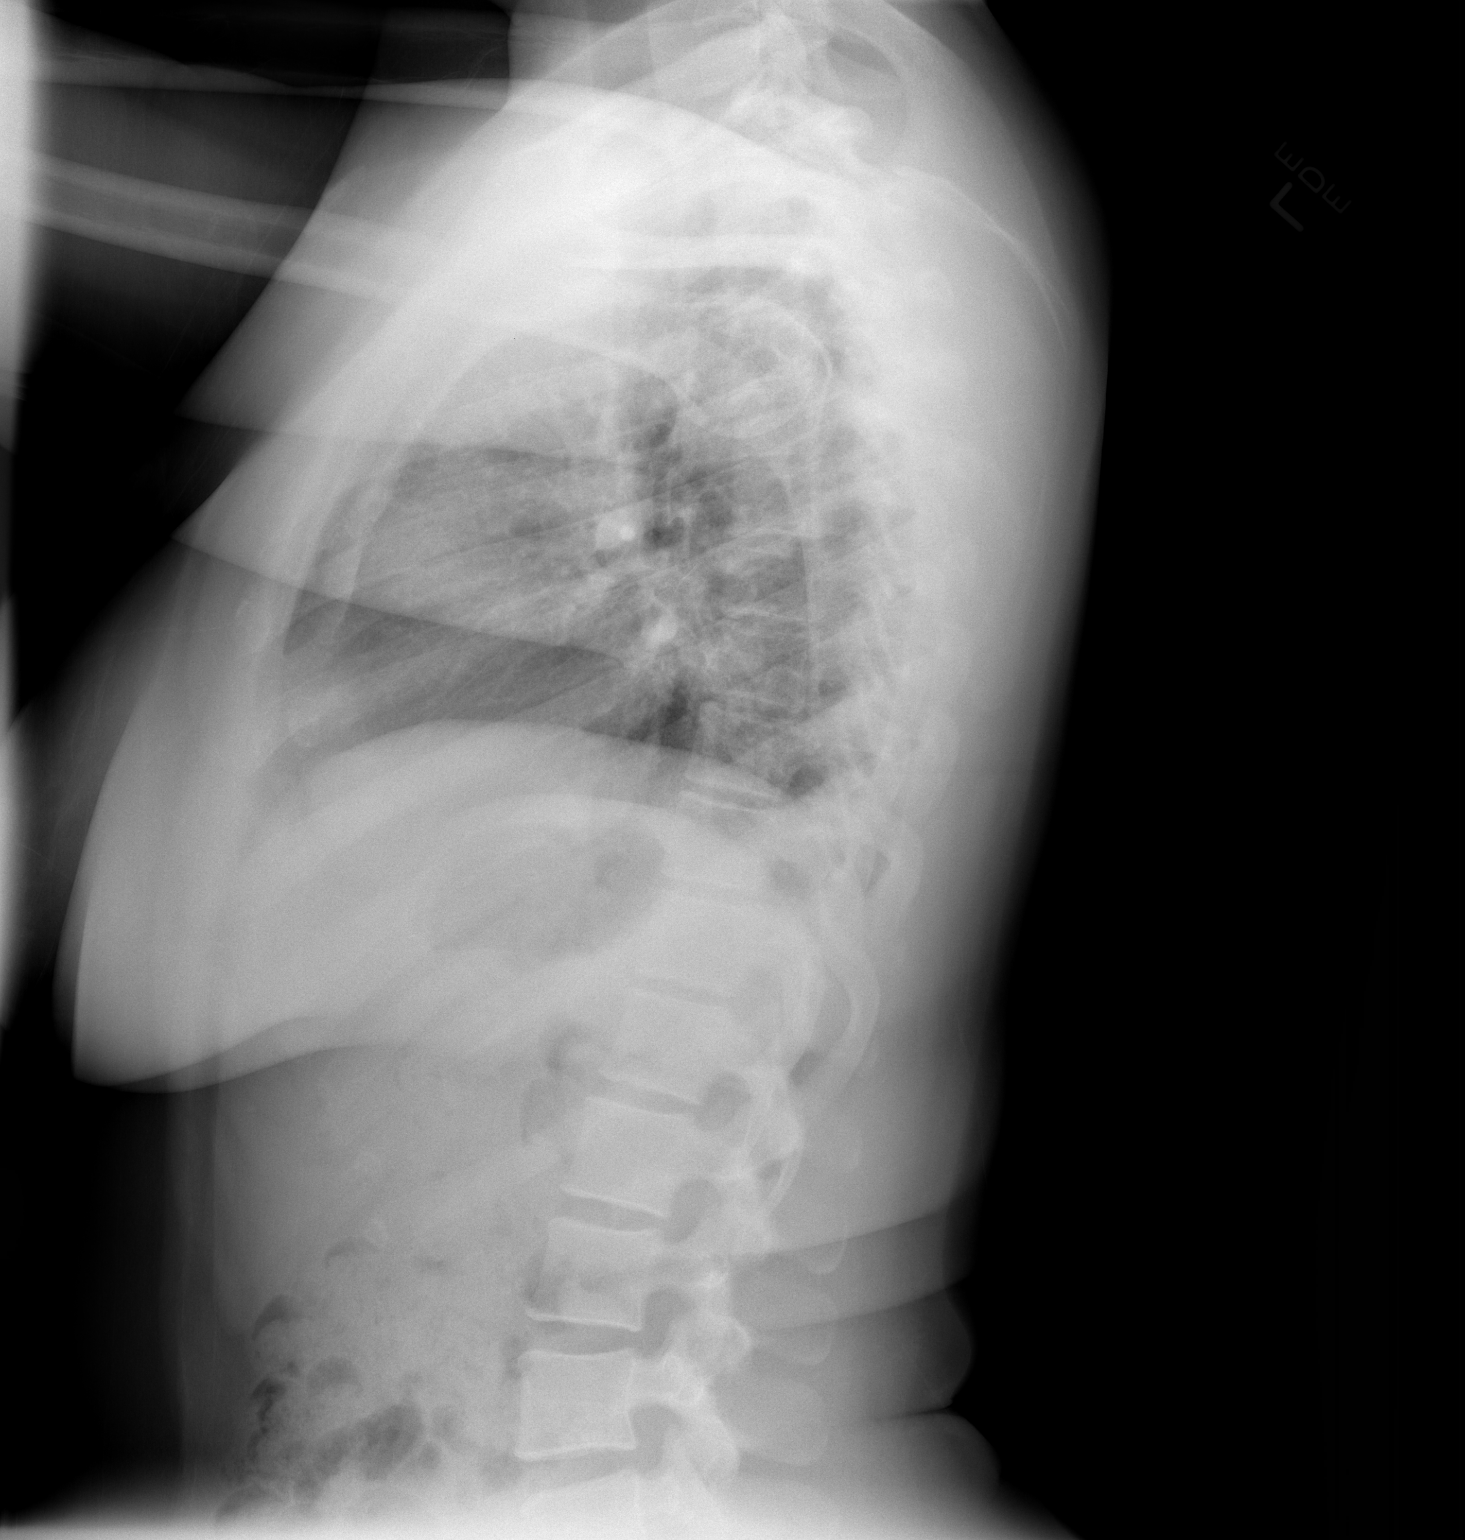

[2 of 2 positions shown; findings below may reference images not displayed]

FINDINGS: No infiltrate, congestive heart failure or pneumothorax..
Heart size within normal limits.  Minimal curvature of the thoracic
and lumbar spine convex to the left.
IMPRESSION: Minimal curvature of the thoracic and lumbar spine otherwise
negative exam.

## 2016-03-19 LAB — GLUCOSE, POCT (MANUAL RESULT ENTRY): POC Glucose: 97 mg/dl (ref 70–99)

## 2018-02-24 ENCOUNTER — Telehealth: Payer: Self-pay | Admitting: Gynecology

## 2018-02-24 NOTE — Telephone Encounter (Signed)
On-call note: Patient calls for birth control pill refill.  On [employer] due to restart this weekend.  One pack called in with instruction for patient to call beginning of this coming week to the office to make sure they want to continue her on the pills and to refill the quantity they choose.  She reports being within 1 year of her annual exam.  She reports having no medical issues other than well-controlled hypertension and never smoked.

## 2018-03-09 ENCOUNTER — Institutional Professional Consult (permissible substitution): Payer: Self-pay | Admitting: Neurology

## 2018-04-05 ENCOUNTER — Other Ambulatory Visit: Payer: Self-pay | Admitting: Internal Medicine

## 2018-04-24 ENCOUNTER — Encounter: Payer: Self-pay | Admitting: Neurology

## 2018-04-27 ENCOUNTER — Institutional Professional Consult (permissible substitution): Payer: Self-pay | Admitting: Neurology

## 2018-05-01 ENCOUNTER — Encounter: Payer: Self-pay | Admitting: Nurse Practitioner

## 2018-05-01 ENCOUNTER — Ambulatory Visit (INDEPENDENT_AMBULATORY_CARE_PROVIDER_SITE_OTHER): Payer: BC Managed Care – PPO | Admitting: Nurse Practitioner

## 2018-05-01 VITALS — BP 110/72 | HR 67 | Temp 98.2°F | Ht 62.5 in | Wt 195.6 lb

## 2018-05-01 DIAGNOSIS — Z23 Encounter for immunization: Secondary | ICD-10-CM | POA: Diagnosis not present

## 2018-05-01 DIAGNOSIS — L219 Seborrheic dermatitis, unspecified: Secondary | ICD-10-CM | POA: Diagnosis not present

## 2018-05-01 MED ORDER — KETOCONAZOLE 2 % EX CREA: TOPICAL_CREAM | Freq: Every day | CUTANEOUS | Status: DC | PRN

## 2018-05-01 NOTE — Progress Notes (Signed)
Subjective:     Patient ID: Regina Jennings , female    DOB: 1976-06-04 , 42 y.o.   MRN: [id number]   Chief Complaint  Patient presents with  . dry scalp    pt states her scalp has been very dry. pt states she tried to make an appointment with the dermatologist that she was previously referred to they gave her an appointment for the 19th.     HPI  She is using multiple natural hair products and has used in the last month.  She admits to not drinking adequate amounts of water. She has an appt with [facility] on November 19th.  Now the problem is interfering with her daily life style and causing her "embarrassment".  Rash  This is a new problem. The current episode started 1 to 4 weeks ago. The problem has been gradually worsening since onset. The affected locations include the scalp, face, left eye, left ear, right eye and right ear. The rash is characterized by scaling and itchiness. Pertinent negatives include no fatigue, fever, joint pain or rhinorrhea. Treatments tried: tetri oil. The treatment provided no relief. There is no history of allergies, asthma or eczema.     Past Medical History:  Diagnosis Date  . 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 Hypertension   . Vitamin D deficiency       Current Outpatient Medications:  .  Cholecalciferol (VITAMIN D) 2000 UNITS tablet, Take 2,000 Units by mouth See admin instructions. Takes 2,000 units every day except Saturday and Sunday, Disp: , Rfl:  .  levonorgestrel-ethinyl estradiol (SEASONALE,INTROVALE,JOLESSA) 0.15-0.03 MG tablet, Take 1 tablet by mouth daily., Disp: 1 Package, Rfl: 11 .  losartan-hydrochlorothiazide (HYZAAR) 50-12.5 MG tablet, TAKE 1 TABLET BY MOUTH EVERY DAY, Disp: 90 tablet, Rfl: 0   Allergies  Allergen Reactions  . Boric Acid Hives  . Flagyl [Metronidazole]   . Sulfa Antibiotics Rash  . Xyzal [Cetirizine Hcl] Rash     Review of Systems  Constitutional: Negative for fatigue  and fever.  HENT: Negative for rhinorrhea.   Eyes: Negative.   Respiratory: Negative.   Cardiovascular: Negative.   Musculoskeletal: Negative.  Negative for joint pain.  Skin: Positive for rash.       Dry scaly scalp with dryness around eyes.   Neurological: Negative.      Today's Vitals   05/01/18 1420  BP: 110/72  Pulse: 67  Temp: 98.2 F (36.8 C)  TempSrc: Oral  SpO2: 97%  Weight: 195 lb 9.6 oz (88.7 kg)  Height: 5' 2.5" (1.588 m)  PainSc: 0-No pain   Body mass index is 35.21 kg/m.   Objective:  Physical Exam  Constitutional: She appears well-developed and well-nourished.  Pulmonary/Chest: Effort normal.  Skin: Skin is warm and dry. Rash noted.           Assessment And Plan:     1. Seborrheic dermatitis of scalp  Hyperpigmented scalp with moderate to severe amount of scaly skin to edges of scalp and throughout  Encouraged to limit the hair products being used and to consult with hair expert for best product to use.  Eucerin cream given for edges and beneath eyes and ears  I will also check for an autoimmune etiology - ketoconazole (NIZORAL) 2 % cream - Autoimmune Profile  2. Need for influenza vaccination  Influenza vaccine administered  Encouraged to take Tylenol as needed for fever or muscle aches. - Flu Vaccine QUAD 6+ mos  PF IM (Fluarix Quad PF)         Arnette Felts, FNP

## 2018-05-01 NOTE — Patient Instructions (Signed)
   Seborrheic Dermatitis, Adult Seborrheic dermatitis is a skin disease that causes red, scaly patches. It usually occurs on the scalp, and it is often called dandruff. The patches may appear on other parts of the body. Skin patches tend to appear where there are many oil glands in the skin. Areas of the body that are commonly affected include:  Scalp.  Skin folds of the body.  Ears.  Eyebrows.  Neck.  Face.  Armpits.  The bearded area of men's faces.  The condition may come and go for no known reason, and it is often long-lasting (chronic). What are the causes? The cause of this condition is not known. What increases the risk? This condition is more likely to develop in people who:  Have certain conditions, such as: ? HIV (human immunodeficiency virus). ? AIDS (acquired immunodeficiency syndrome). ? Parkinson disease. ? Mood disorders, such as depression.  Are 24-39 years old.  What are the signs or symptoms? Symptoms of this condition include:  Thick scales on the scalp.  Redness on the face or in the armpits.  Skin that is flaky. The flakes may be white or yellow.  Skin that seems oily or dry but is not helped with moisturizers.  Itching or burning in the affected areas.  How is this diagnosed? This condition is diagnosed with a medical history and physical exam. A sample of your skin may be tested (skin biopsy). You may need to see a skin specialist (dermatologist). How is this treated? There is no cure for this condition, but treatment can help to manage the symptoms. You may get treatment to remove scales, lower the risk of skin infection, and reduce swelling or itching. Treatment may include:  Creams that reduce swelling and irritation (steroids).  Creams that reduce skin yeast.  Medicated shampoo, soaps, moisturizing creams, or ointments.  Medicated moisturizing creams or ointments.  Follow these instructions at home:  Apply over-the-counter and  prescription medicines only as told by your health care provider.  Use any medicated shampoo, soaps, skin creams, or ointments only as told by your health care provider.  Keep all follow-up visits as told by your health care provider. This is important. Contact a health care provider if:  Your symptoms do not improve with treatment.  Your symptoms get worse.  You have new symptoms. This information is not intended to replace advice given to you by your health care provider. Make sure you discuss any questions you have with your health care provider. Document Released: 06/21/2005 Document Revised: 01/09/2016 Document Reviewed: 10/09/2015 Elsevier Interactive Patient Education  2018 Elsevier Inc[employer]   1. Use warmed mineral oil (NOT HOT) to scalp and massage  2. Use Nizoral shampoo to scalp twice a week. 3. Continue to call Dermatology in the event they have a cancellation 4. Use Eucrisa sample beneath eye (avoid getting in eyes), if effective you can use to outer edges of scalp as well call for a prescription

## 2018-05-02 ENCOUNTER — Telehealth: Payer: Self-pay

## 2018-05-02 LAB — AUTOIMMUNE PROFILE
Anti Nuclear Antibody(ANA): NEGATIVE
COMPLEMENT C3, SERUM: 153 mg/dL (ref 82–167)
dsDNA Ab: 1 IU/mL (ref 0–9)

## 2018-05-02 NOTE — Telephone Encounter (Signed)
Patient called stating she went to the store to pick up the mineral oil as recommended but she got confused when she saw what it was used for she stated it said it was used for constipation.  I returned pt call and advised her I would speak with you about it once you returned to work on tomorrow but she stated it was ok she has an appointment with the dermatologist this week so she is going to wait and see what they say. YRL,RMA

## 2018-05-05 ENCOUNTER — Other Ambulatory Visit: Payer: Self-pay

## 2018-05-05 MED ORDER — KETOCONAZOLE 2 % EX SHAM: MEDICATED_SHAMPOO | CUTANEOUS | 3 refills | Status: DC

## 2018-05-24 ENCOUNTER — Other Ambulatory Visit: Payer: Self-pay | Admitting: Gynecology

## 2018-07-06 ENCOUNTER — Other Ambulatory Visit: Payer: Self-pay

## 2018-07-06 MED ORDER — LOSARTAN POTASSIUM-HCTZ 50-12.5 MG PO TABS: 1.0000 | ORAL_TABLET | Freq: Every day | ORAL | 1 refills | Status: DC

## 2018-07-17 ENCOUNTER — Other Ambulatory Visit: Payer: Self-pay | Admitting: Internal Medicine

## 2018-08-17 ENCOUNTER — Encounter: Payer: Self-pay | Admitting: Internal Medicine

## 2018-08-17 ENCOUNTER — Ambulatory Visit: Payer: BC Managed Care – PPO | Admitting: Internal Medicine

## 2018-08-17 VITALS — BP 120/78 | HR 85 | Temp 98.2°F | Ht 62.5 in | Wt 190.6 lb

## 2018-08-17 DIAGNOSIS — L219 Seborrheic dermatitis, unspecified: Secondary | ICD-10-CM | POA: Diagnosis not present

## 2018-08-17 DIAGNOSIS — I10 Essential (primary) hypertension: Secondary | ICD-10-CM | POA: Insufficient documentation

## 2018-08-17 DIAGNOSIS — R7309 Other abnormal glucose: Secondary | ICD-10-CM | POA: Insufficient documentation

## 2018-08-17 DIAGNOSIS — E6609 Other obesity due to excess calories: Secondary | ICD-10-CM | POA: Diagnosis not present

## 2018-08-17 DIAGNOSIS — Z6834 Body mass index (BMI) 34.0-34.9, adult: Secondary | ICD-10-CM

## 2018-08-17 NOTE — Patient Instructions (Signed)
Seborrheic Dermatitis, Adult  Seborrheic dermatitis is a skin disease that causes red, scaly patches. It usually occurs on the scalp, and it is often called dandruff. The patches may appear on other parts of the body. Skin patches tend to appear where there are many oil glands in the skin. Areas of the body that are commonly affected include:   Scalp.   Skin folds of the body.   Ears.   Eyebrows.   Neck.   Face.   Armpits.   The bearded area of men's faces.  The condition may come and go for no known reason, and it is often long-lasting (chronic).  What are the causes?  The cause of this condition is not known.  What increases the risk?  This condition is more likely to develop in people who:   Have certain conditions, such as:  ? HIV (human immunodeficiency virus).  ? AIDS (acquired immunodeficiency syndrome).  ? Parkinson disease.  ? Mood disorders, such as depression.   Are 40-60 years old.  What are the signs or symptoms?  Symptoms of this condition include:   Thick scales on the scalp.   Redness on the face or in the armpits.   Skin that is flaky. The flakes may be white or yellow.   Skin that seems oily or dry but is not helped with moisturizers.   Itching or burning in the affected areas.  How is this diagnosed?  This condition is diagnosed with a medical history and physical exam. A sample of your skin may be tested (skin biopsy). You may need to see a skin specialist (dermatologist).  How is this treated?  There is no cure for this condition, but treatment can help to manage the symptoms. You may get treatment to remove scales, lower the risk of skin infection, and reduce swelling or itching. Treatment may include:   Creams that reduce swelling and irritation (steroids).   Creams that reduce skin yeast.   Medicated shampoo, soaps, moisturizing creams, or ointments.   Medicated moisturizing creams or ointments.  Follow these instructions at home:   Apply over-the-counter and prescription  medicines only as told by your health care provider.   Use any medicated shampoo, soaps, skin creams, or ointments only as told by your health care provider.   Keep all follow-up visits as told by your health care provider. This is important.  Contact a health care provider if:   Your symptoms do not improve with treatment.   Your symptoms get worse.   You have new symptoms.  This information is not intended to replace advice given to you by your health care provider. Make sure you discuss any questions you have with your health care provider.  Document Released: 06/21/2005 Document Revised: 01/09/2016 Document Reviewed: 10/09/2015  Elsevier Interactive Patient Education  2019 Elsevier Inc.

## 2018-08-18 ENCOUNTER — Other Ambulatory Visit: Payer: Self-pay

## 2018-08-18 LAB — HEMOGLOBIN A1C
Est. average glucose Bld gHb Est-mCnc: 117 mg/dL
Hgb A1c MFr Bld: 5.7 % — ABNORMAL HIGH (ref 4.8–5.6)

## 2018-08-18 LAB — CMP14+EGFR
ALBUMIN: 4.3 g/dL (ref 3.8–4.8)
ALT: 14 IU/L (ref 0–32)
AST: 15 IU/L (ref 0–40)
Albumin/Globulin Ratio: 1.4 (ref 1.2–2.2)
Alkaline Phosphatase: 57 IU/L (ref 39–117)
BILIRUBIN TOTAL: 0.4 mg/dL (ref 0.0–1.2)
BUN / CREAT RATIO: 11 (ref 9–23)
BUN: 12 mg/dL (ref 6–24)
CO2: 25 mmol/L (ref 20–29)
CREATININE: 1.07 mg/dL — AB (ref 0.57–1.00)
Calcium: 9.8 mg/dL (ref 8.7–10.2)
Chloride: 100 mmol/L (ref 96–106)
GFR, EST AFRICAN AMERICAN: 74 mL/min/{1.73_m2} (ref >59)
GFR, EST NON AFRICAN AMERICAN: 64 mL/min/{1.73_m2} (ref >59)
Globulin, Total: 3.1 g/dL (ref 1.5–4.5)
Glucose: 88 mg/dL (ref 65–99)
Potassium: 3.9 mmol/L (ref 3.5–5.2)
Sodium: 139 mmol/L (ref 134–144)
TOTAL PROTEIN: 7.4 g/dL (ref 6.0–8.5)

## 2018-08-22 ENCOUNTER — Telehealth: Payer: Self-pay

## 2018-08-22 ENCOUNTER — Other Ambulatory Visit: Payer: Self-pay

## 2018-08-22 NOTE — Telephone Encounter (Signed)
-----   Message from Dorothyann Peng, MD sent at 08/18/2018 10:50 PM EST ----- Your liver and kidney fxn are stable. Your hba1c is 5.7, this is in predm range.  Increase exercise and avoid sugary beverages. Aim for 30 minutes five days weekly.

## 2018-08-22 NOTE — Telephone Encounter (Signed)
Left the pt a message to call back for her lab results. 

## 2018-08-28 NOTE — Progress Notes (Signed)
Subjective:     Patient ID: Regina Jennings , female    DOB: 04-Apr-1976 , 43 y.o.   MRN: [id number]   Chief Complaint  Patient presents with  . Hypertension    HPI  Hypertension  This is a chronic problem. The current episode started more than 1 year ago. The problem has been gradually improving since onset. The problem is controlled. Pertinent negatives include no blurred vision, chest pain, palpitations or shortness of breath.   She reports compliance with meds.   Past Medical History:  Diagnosis Date  . 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 Hypertension   . Vitamin D deficiency      Family History  Problem Relation Age of Onset  . Stroke Maternal Grandmother   . Migraines Mother   . Ulcers Mother   . Hypertension Mother      Current Outpatient Medications:  .  Cholecalciferol (VITAMIN D) 2000 UNITS tablet, Take 2,000 Units by mouth See admin instructions. Takes 2,000 units every day except Saturday and Sunday, Disp: , Rfl:  .  ketoconazole (NIZORAL) 2 % shampoo, Apply to scalp daily prn, Disp: 120 mL, Rfl: 3 .  levonorgestrel-ethinyl estradiol (SETLAKIN) 0.15-0.03 MG tablet, Setlakin 0.15 mg-30 mcg (91) tablets,3 month dose pack, Disp: , Rfl:  .  losartan-hydrochlorothiazide (HYZAAR) 50-12.5 MG tablet, TAKE 1 TABLET BY MOUTH EVERY DAY, Disp: 90 tablet, Rfl: 1 .  Homeopathic Products (OSCILLOCOCCINUM PO), Take by mouth., Disp: , Rfl:   Current Facility-Administered Medications:  .  ketoconazole (NIZORAL) 2 % cream, , Topical, Daily PRN, Minette Brine, FNP   Allergies  Allergen Reactions  . Boric Acid Hives  . Flagyl [Metronidazole]   . Sulfa Antibiotics Rash  . Xyzal [Cetirizine Hcl] Rash     Review of Systems  Constitutional: Negative.   Eyes: Negative for blurred vision.  Respiratory: Negative.  Negative for shortness of breath.   Cardiovascular: Negative.  Negative for chest pain and palpitations.  Gastrointestinal: Negative.   Skin:        She has seborrheic dermatitis. Has been eval by Derm. She reports she has had some improvement In her sx.   Neurological: Negative.   Psychiatric/Behavioral: Negative.      Today's Vitals   08/17/18 1553  BP: 120/78  Pulse: 85  Temp: 98.2 F (36.8 C)  TempSrc: Oral  Weight: 190 lb 9.6 oz (86.5 kg)  Height: 5' 2.5" (1.588 m)   Body mass index is 34.31 kg/m.   Objective:  Physical Exam Vitals signs and nursing note reviewed.  Constitutional:      Appearance: Normal appearance.  HENT:     Head: Normocephalic and atraumatic.  Cardiovascular:     Rate and Rhythm: Normal rate and regular rhythm.     Heart sounds: Normal heart sounds.  Pulmonary:     Effort: Pulmonary effort is normal.     Breath sounds: Normal breath sounds.  Skin:    General: Skin is warm.  Neurological:     General: No focal deficit present.     Mental Status: She is alert.  Psychiatric:        Mood and Affect: Mood normal.        Behavior: Behavior normal.         Assessment And Plan:     1. Essential hypertension, benign  Well controlled. She will continue with current meds. She is encouraged to avoid adding salt to her foods.   - CMP14+EGFR  2. Seborrheic dermatitis of scalp  Chronic. As per Derm.   3. Other abnormal glucose  HER A1C HAS BEEN ELEVATED IN THE PAST. I WILL CHECK AN A1C, BMET TODAY. SHE WAS ENCOURAGED TO AVOID SUGARY BEVERAGES AND PROCESSED FOODS INCLUDNG BREADS, RICE AND PASTA.  - Hemoglobin A1c  4. Class 1 obesity due to excess calories without serious comorbidity with body mass index (BMI) of 34.0 to 34.9 in adult  Importance of achieving optimal weight to decrease risk of cardiovascular disease and cancers was discussed with the patient in full detail. She is encouraged to start slowly - start with 10 minutes twice daily at least three to four days per week and to gradually build to 30 minutes five days weekly. She was given tips to incorporate more activity into  her daily routine - take stairs when possible, park farther away from her job, grocery stores, etc.   Maximino Greenland, MD

## 2018-09-19 LAB — HM PAP SMEAR: HM Pap smear: NORMAL

## 2018-10-09 ENCOUNTER — Other Ambulatory Visit: Payer: Self-pay | Admitting: Internal Medicine

## 2019-01-06 ENCOUNTER — Other Ambulatory Visit: Payer: Self-pay | Admitting: Internal Medicine

## 2019-01-09 ENCOUNTER — Encounter: Payer: Self-pay | Admitting: Internal Medicine

## 2019-01-09 ENCOUNTER — Other Ambulatory Visit: Payer: Self-pay

## 2019-01-09 LAB — POCT INR: INR: 2.1 — AB (ref 0.9–1.1)

## 2019-01-09 MED ORDER — HYDROCHLOROTHIAZIDE 12.5 MG PO TABS: 12.5000 mg | ORAL_TABLET | Freq: Every day | ORAL | 0 refills | Status: DC

## 2019-01-09 MED ORDER — LOSARTAN POTASSIUM 50 MG PO TABS: 50.0000 mg | ORAL_TABLET | Freq: Every day | ORAL | 0 refills | Status: DC

## 2019-02-27 ENCOUNTER — Encounter: Payer: Self-pay | Admitting: Internal Medicine

## 2019-02-27 ENCOUNTER — Ambulatory Visit: Payer: BC Managed Care – PPO | Admitting: Internal Medicine

## 2019-02-27 ENCOUNTER — Other Ambulatory Visit: Payer: Self-pay

## 2019-02-27 VITALS — BP 136/84 | HR 84 | Temp 98.4°F | Ht 63.4 in | Wt 184.6 lb

## 2019-02-27 DIAGNOSIS — L700 Acne vulgaris: Secondary | ICD-10-CM | POA: Diagnosis not present

## 2019-02-27 DIAGNOSIS — I10 Essential (primary) hypertension: Secondary | ICD-10-CM

## 2019-02-27 DIAGNOSIS — R0683 Snoring: Secondary | ICD-10-CM

## 2019-02-27 DIAGNOSIS — Z0000 Encounter for general adult medical examination without abnormal findings: Secondary | ICD-10-CM | POA: Diagnosis not present

## 2019-02-27 LAB — POCT URINALYSIS DIPSTICK
Bilirubin, UA: NEGATIVE
Glucose, UA: NEGATIVE
Ketones, UA: NEGATIVE
Leukocytes, UA: NEGATIVE
Nitrite, UA: NEGATIVE
Protein, UA: NEGATIVE
Spec Grav, UA: 1.025 (ref 1.010–1.025)
Urobilinogen, UA: 0.2 E.U./dL
pH, UA: 6 (ref 5.0–8.0)

## 2019-02-27 LAB — POCT UA - MICROALBUMIN
Albumin/Creatinine Ratio, Urine, POC: 30
Creatinine, POC: 300 mg/dL
Microalbumin Ur, POC: 10 mg/L

## 2019-02-27 NOTE — Patient Instructions (Signed)
Health Maintenance, Female Adopting a healthy lifestyle and getting preventive care are important in promoting health and wellness. Ask your health care provider about:  The right schedule for you to have regular tests and exams.  Things you can do on your own to prevent diseases and keep yourself healthy. What should I know about diet, weight, and exercise? Eat a healthy diet   Eat a diet that includes plenty of vegetables, fruits, low-fat dairy products, and lean protein.  Do not eat a lot of foods that are high in solid fats, added sugars, or sodium. Maintain a healthy weight Body mass index (BMI) is used to identify weight problems. It estimates body fat based on height and weight. Your health care provider can help determine your BMI and help you achieve or maintain a healthy weight. Get regular exercise Get regular exercise. This is one of the most important things you can do for your health. Most adults should:  Exercise for at least 150 minutes each week. The exercise should increase your heart rate and make you sweat (moderate-intensity exercise).  Do strengthening exercises at least twice a week. This is in addition to the moderate-intensity exercise.  Spend less time sitting. Even light physical activity can be beneficial. Watch cholesterol and blood lipids Have your blood tested for lipids and cholesterol at 43 years of age, then have this test every 5 years. Have your cholesterol levels checked more often if:  Your lipid or cholesterol levels are high.  You are older than 43 years of age.  You are at high risk for heart disease. What should I know about cancer screening? Depending on your health history and family history, you may need to have cancer screening at various ages. This may include screening for:  Breast cancer.  Cervical cancer.  Colorectal cancer.  Skin cancer.  Lung cancer. What should I know about heart disease, diabetes, and high blood  pressure? Blood pressure and heart disease  High blood pressure causes heart disease and increases the risk of stroke. This is more likely to develop in people who have high blood pressure readings, are of African descent, or are overweight.  Have your blood pressure checked: ? Every 3-5 years if you are 18-39 years of age. ? Every year if you are 40 years old or older. Diabetes Have regular diabetes screenings. This checks your fasting blood sugar level. Have the screening done:  Once every three years after age 40 if you are at a normal weight and have a low risk for diabetes.  More often and at a younger age if you are overweight or have a high risk for diabetes. What should I know about preventing infection? Hepatitis B If you have a higher risk for hepatitis B, you should be screened for this virus. Talk with your health care provider to find out if you are at risk for hepatitis B infection. Hepatitis C Testing is recommended for:  Everyone born from 1945 through 1965.  Anyone with known risk factors for hepatitis C. Sexually transmitted infections (STIs)  Get screened for STIs, including gonorrhea and chlamydia, if: ? You are sexually active and are younger than 43 years of age. ? You are older than 43 years of age and your health care provider tells you that you are at risk for this type of infection. ? Your sexual activity has changed since you were last screened, and you are at increased risk for chlamydia or gonorrhea. Ask your health care provider if   you are at risk.  Ask your health care provider about whether you are at high risk for HIV. Your health care provider may recommend a prescription medicine to help prevent HIV infection. If you choose to take medicine to prevent HIV, you should first get tested for HIV. You should then be tested every 3 months for as long as you are taking the medicine. Pregnancy  If you are about to stop having your period (premenopausal) and  you may become pregnant, seek counseling before you get pregnant.  Take 400 to 800 micrograms (mcg) of folic acid every day if you become pregnant.  Ask for birth control (contraception) if you want to prevent pregnancy. Osteoporosis and menopause Osteoporosis is a disease in which the bones lose minerals and strength with aging. This can result in bone fractures. If you are 65 years old or older, or if you are at risk for osteoporosis and fractures, ask your health care provider if you should:  Be screened for bone loss.  Take a calcium or vitamin D supplement to lower your risk of fractures.  Be given hormone replacement therapy (HRT) to treat symptoms of menopause. Follow these instructions at home: Lifestyle  Do not use any products that contain nicotine or tobacco, such as cigarettes, e-cigarettes, and chewing tobacco. If you need help quitting, ask your health care provider.  Do not use street drugs.  Do not share needles.  Ask your health care provider for help if you need support or information about quitting drugs. Alcohol use  Do not drink alcohol if: ? Your health care provider tells you not to drink. ? You are pregnant, may be pregnant, or are planning to become pregnant.  If you drink alcohol: ? Limit how much you use to 0-1 drink a day. ? Limit intake if you are breastfeeding.  Be aware of how much alcohol is in your drink. In the [place] one drink equals one 12 oz bottle of beer (355 mL), one 5 oz glass of wine (148 mL), or one 1 oz glass of hard liquor (44 mL). General instructions  Schedule regular health, dental, and eye exams.  Stay current with your vaccines.  Tell your health care provider if: ? You often feel depressed. ? You have ever been abused or do not feel safe at home. Summary  Adopting a healthy lifestyle and getting preventive care are important in promoting health and wellness.  Follow your health care provider's instructions about healthy  diet, exercising, and getting tested or screened for diseases.  Follow your health care provider's instructions on monitoring your cholesterol and blood pressure. This information is not intended to replace advice given to you by your health care provider. Make sure you discuss any questions you have with your health care provider. Document Released: 01/04/2011 Document Revised: 06/14/2018 Document Reviewed: 06/14/2018 Elsevier Patient Education  2020 [employer]vier Inc.  

## 2019-02-27 NOTE — Progress Notes (Signed)
Subjective:     Patient ID: Regina Jennings , female    DOB: 1975/08/27 , 43 y.o.   MRN: [id number]   Chief Complaint  Patient presents with  . Annual Exam  . Hypertension    HPI  She is here today for a full physical examination.  She is followed by Earnstine Regal, PA for her GYN exams.    Hypertension This is a chronic problem. The current episode started more than 1 year ago. The problem has been gradually improving since onset. The problem is controlled. Pertinent negatives include no blurred vision, palpitations or shortness of breath. Risk factors for coronary artery disease include sedentary lifestyle. Past treatments include angiotensin blockers and diuretics. The current treatment provides moderate improvement. Compliance problems include exercise.      Past Medical History:  Diagnosis Date  . 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 Hypertension   . Vitamin D deficiency      Family History  Problem Relation Age of Onset  . Stroke Maternal Grandmother   . Migraines Mother   . Ulcers Mother   . Hypertension Mother      Current Outpatient Medications:  .  Cholecalciferol (VITAMIN D) 2000 UNITS tablet, Take 1,000 Units by mouth See admin instructions. Takes 2,000 units every day except Saturday and Sunday , Disp: , Rfl:  .  Homeopathic Products (OSCILLOCOCCINUM PO), Take by mouth., Disp: , Rfl:  .  hydrochlorothiazide (HYDRODIURIL) 12.5 MG tablet, Take 1 tablet (12.5 mg total) by mouth daily., Disp: 90 tablet, Rfl: 0 .  ketoconazole (NIZORAL) 2 % shampoo, Apply to scalp daily prn, Disp: 120 mL, Rfl: 3 .  levonorgestrel-ethinyl estradiol (SETLAKIN) 0.15-0.03 MG tablet, Setlakin 0.15 mg-30 mcg (91) tablets,3 month dose pack, Disp: , Rfl:  .  losartan (COZAAR) 50 MG tablet, Take 1 tablet (50 mg total) by mouth daily., Disp: 90 tablet, Rfl: 0   Allergies  Allergen Reactions  . Boric Acid Hives  . Flagyl [Metronidazole]   . Sulfa Antibiotics Rash  .  Xyzal [Cetirizine Hcl] Rash     Review of Systems  Constitutional: Negative.   HENT: Negative.   Eyes: Negative.  Negative for blurred vision.  Respiratory: Negative.  Negative for shortness of breath.        She reports she does snore. She admits that she never went for sleep study.   Cardiovascular: Negative.  Negative for palpitations.  Endocrine: Negative.   Genitourinary: Negative.   Musculoskeletal: Negative.   Skin: Negative.        She has "mole" on her back. Does not itch. Feels like it has gotten larger.   Allergic/Immunologic: Negative.   Neurological: Negative.   Hematological: Negative.   Psychiatric/Behavioral: Negative.      Today's Vitals   02/27/19 0844  BP: 136/84  Pulse: 84  Temp: 98.4 F (36.9 C)  TempSrc: Oral  Weight: 184 lb 9.6 oz (83.7 kg)  Height: 5' 3.4" (1.61 m)   Body mass index is 32.29 kg/m.   Objective:  Physical Exam Vitals signs and nursing note reviewed.  Constitutional:      Appearance: Normal appearance. She is obese.  HENT:     Head: Normocephalic and atraumatic.     Right Ear: Tympanic membrane, ear canal and external ear normal.     Left Ear: Tympanic membrane, ear canal and external ear normal.     Nose: Nose normal.     Mouth/Throat:     Mouth:  Mucous membranes are moist.     Pharynx: Oropharynx is clear.  Eyes:     Extraocular Movements: Extraocular movements intact.     Conjunctiva/sclera: Conjunctivae normal.     Pupils: Pupils are equal, round, and reactive to light.  Neck:     Musculoskeletal: Normal range of motion and neck supple.  Cardiovascular:     Rate and Rhythm: Normal rate and regular rhythm.     Pulses: Normal pulses.     Heart sounds: Normal heart sounds.  Pulmonary:     Effort: Pulmonary effort is normal.     Breath sounds: Normal breath sounds.  Chest:     Breasts: Tanner Score is 5.        Right: Normal. No swelling, bleeding, inverted nipple, mass, nipple discharge or skin change.        Left:  Normal. No swelling, bleeding, inverted nipple, mass, nipple discharge or skin change.  Abdominal:     General: Abdomen is flat. Bowel sounds are normal.     Palpations: Abdomen is soft.  Genitourinary:    Comments: deferred Musculoskeletal: Normal range of motion.  Skin:    General: Skin is warm and dry.          Comments: Comedone present. No surrounding erythema. No vesicular lesions noted.   Neurological:     General: No focal deficit present.     Mental Status: She is alert and oriented to person, place, and time.  Psychiatric:        Mood and Affect: Mood normal.        Behavior: Behavior normal.         Assessment And Plan:     1. Routine general medical examination at health care facility  A full exam was performed.  Importance of monthly self breast exams was discussed with the patient. PATIENT HAS BEEN ADVISED TO GET 30-45 MINUTES REGULAR EXERCISE NO LESS THAN FOUR TO FIVE DAYS PER WEEK - BOTH WEIGHTBEARING EXERCISES AND AEROBIC ARE RECOMMENDED.  SHE WAS ADVISED TO FOLLOW A HEALTHY DIET WITH AT LEAST SIX FRUITS/VEGGIES PER DAY, DECREASE INTAKE OF RED MEAT, AND TO INCREASE FISH INTAKE TO TWO DAYS PER WEEK.  MEATS/FISH SHOULD NOT BE FRIED, BAKED OR BROILED IS PREFERABLE.  I SUGGEST WEARING SPF 50 SUNSCREEN ON EXPOSED PARTS AND ESPECIALLY WHEN IN THE DIRECT SUNLIGHT FOR AN EXTENDED PERIOD OF TIME.  PLEASE AVOID FAST FOOD RESTAURANTS AND INCREASE YOUR WATER INTAKE.  - CMP14+EGFR - CBC - Lipid panel - Hemoglobin A1c  2. Essential hypertension, benign  Chronic, fair control. She is aware optimal bp is less than 130/80.  She is encouraged to incorporate more exercise into his daily routine.  EKG performed, no new changes noted. She will rto in six months for re-evaluation.   - EKG 12-Lead - POCT Urinalysis Dipstick (81002) - POCT UA - Microalbumin  3. Snoring  She agrees to repeat referral to Neuro for formal sleep testing. Risks associated with untreated OSA were  discussed with the patient.  - Ambulatory referral to Neurology  4. Comedone  After obtaining verbal consent, this was removed from her upper back without any complications. It was easy to remove, it came off with use of alcohol swab only. No other similar lesions were observed.    Maximino Greenland, MD    THE PATIENT IS ENCOURAGED TO PRACTICE SOCIAL DISTANCING DUE TO THE COVID-19 PANDEMIC.

## 2019-03-14 ENCOUNTER — Other Ambulatory Visit: Payer: Self-pay

## 2019-03-14 ENCOUNTER — Ambulatory Visit: Payer: BC Managed Care – PPO | Admitting: Neurology

## 2019-03-14 ENCOUNTER — Encounter: Payer: Self-pay | Admitting: Neurology

## 2019-03-14 VITALS — BP 132/92 | HR 58 | Ht 63.5 in | Wt 186.0 lb

## 2019-03-14 DIAGNOSIS — E669 Obesity, unspecified: Secondary | ICD-10-CM

## 2019-03-14 DIAGNOSIS — R351 Nocturia: Secondary | ICD-10-CM

## 2019-03-14 DIAGNOSIS — R0683 Snoring: Secondary | ICD-10-CM | POA: Diagnosis not present

## 2019-03-14 DIAGNOSIS — G4719 Other hypersomnia: Secondary | ICD-10-CM

## 2019-03-14 NOTE — Patient Instructions (Addendum)
Thank you for choosing [facility] for your sleep related care! It was nice to meet you today! I appreciate that you entrust me with your sleep related healthcare concerns. I hope, I was able to address at least some of your concerns today, and that I can help you feel reassured and also get better.    Here is what we discussed today and what we came up with as our plan for you:    Based on your symptoms and your exam I believe you are at risk for obstructive sleep apnea (aka OSA), and I think we should proceed with a sleep study to determine whether you do or do not have OSA and how severe it is.As per your preference, I will order a home sleep test.  Even, if you have mild OSA, I may want you to consider treatment with CPAP, as treatment of even borderline or mild sleep apnea can result and improvement of symptoms such as sleep disruption, daytime sleepiness, nighttime bathroom breaks, restless leg symptoms, improvement of headache syndromes, even improved mood disorder.   Please remember, the long-term risks and ramifications of untreated moderate to severe obstructive sleep apnea are: increased Cardiovascular disease, including congestive heart failure, stroke, difficult to control hypertension, treatment resistant obesity, arrhythmias, especially irregular heartbeat commonly known as A. Fib. (atrial fibrillation); even type 2 diabetes has been linked to untreated OSA.   Sleep apnea can cause disruption of sleep and sleep deprivation in most cases, which, in turn, can cause recurrent headaches, problems with memory, mood, concentration, focus, and vigilance. Most people with untreated sleep apnea report excessive daytime sleepiness, which can affect their ability to drive. Please do not drive if you feel sleepy. Patients with sleep apnea developed difficulty initiating and maintaining sleep (aka insomnia).   Having sleep apnea may increase your risk for other sleep disorders,  including involuntary behaviors sleep such as sleep terrors, sleep talking, sleepwalking.    Having sleep apnea can also increase your risk for restless leg syndrome and leg movements at night.   Please note that untreated obstructive sleep apnea may carry additional perioperative morbidity. Patients with significant obstructive sleep apnea (typically, in the moderate to severe degree) should receive, if possible, perioperative PAP (positive airway pressure) therapy and the surgeons and particularly the anesthesiologists should be informed of the diagnosis and the severity of the sleep disordered breathing.   I will likely see you back after your sleep study to go over the test results and where to go from there. We will call you after your sleep study to advise about the results (most likely, you will hear from [place], my nurse) and to set up an appointment at the time, as necessary.    Our sleep lab administrative assistant will call you to schedule your sleep study and give you further instructions, regarding the check in process for the sleep study, arrival time, what to bring, when you can expect to leave after the study, etc., and to answer any other logistical questions you may have. If you don't hear back from her by about 2 weeks from now, please feel free to call her direct line at [phone] or you can call our general clinic number, or email [name] through My Chart.

## 2019-03-14 NOTE — Progress Notes (Signed)
Subjective:    Patient ID: EDELIN FRYER is a 43 y.o. female.  HPI     Star Age, MD, PhD [facility] [address] [place], [place] [address]  Dear Dr. Baird Cancer,   I saw your patient, Yaret Hush, upon your kind request in my sleep clinic today for initial consultation of her sleep disorder, in particular, concern for underlying obstructive sleep apnea.  The patient is unaccompanied today.  As you know, Ms. Argueta is a 43 year old right-handed woman with an underlying medical history of hypertension, vitamin D deficiency, and obesity, who reports snoring and excessive daytime somnolence.  I reviewed your office note from 02/27/2019. Her Epworth sleepiness score is 15 out of 24, fatigue severity score is 34 out of 63.  She is single and lives with her daughter, she works at [employer], as Chief Strategy Officer, currently works from home and a more variable sleep schedule.  Her daughter is 43 and also attends [employer]. Patient's bedtime is currently around 11, rise time around 8.  She has nocturia once per average night, she denies any recurrent morning headaches, she is not aware of any family history of OSA.  She does not wake up rested and does not get good consolidated sleep, it seems like she consistently wakes up after the first 4 hours and then does not get any good deep sleep afterwards.  She is working on weight loss.  She is a non-smoker and drinks alcohol occasionally, no daily caffeine either. Lately, she has noted the need to take some deeper breaths when she is awake.  She feels like she does not always breathe deeply enough.   Her Past Medical History Is Significant For: Past Medical History:  Diagnosis Date  . 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 Hypertension   . Vitamin D deficiency     Her Past Surgical History Is Significant For: Past Surgical History:  Procedure Laterality Date  . WISDOM TOOTH EXTRACTION       Her Family History Is Significant For: Family History  Problem Relation Age of Onset  . Stroke Maternal Grandmother   . Migraines Mother   . Ulcers Mother   . Hypertension Mother     Her Social History Is Significant For: Social History   Socioeconomic History  . Marital status: Single    Spouse name: Not on file  . Number of children: Not on file  . Years of education: Not on file  . Highest education level: Not on file  Occupational History  . Not on file  Social Needs  . Financial resource strain: Not on file  . Food insecurity    Worry: Not on file    Inability: Not on file  . Transportation needs    Medical: Not on file    Non-medical: Not on file  Tobacco Use  . Smoking status: Never Smoker  . Smokeless tobacco: Never Used  Substance and Sexual Activity  . Alcohol use: Yes  . Drug use: No  . Sexual activity: Yes    Birth control/protection: Pill    Comment: JOLESSA  Lifestyle  . Physical activity    Days per week: Not on file    Minutes per session: Not on file  . Stress: Not on file  Relationships  . Social Herbalist on phone: Not on file    Gets together: Not on file    Attends religious service:  Not on file    Active member of club or organization: Not on file    Attends meetings of clubs or organizations: Not on file    Relationship status: Not on file  Other Topics Concern  . Not on file  Social History Narrative  . Not on file    Her Allergies Are:  Allergies  Allergen Reactions  . Boric Acid Hives  . Flagyl [Metronidazole]   . Sulfa Antibiotics Rash  . Xyzal [Cetirizine Hcl] Rash  :   Her Current Medications Are:  Outpatient Encounter Medications as of 03/14/2019  Medication Sig  . Cholecalciferol (VITAMIN D) 2000 UNITS tablet Take 1,000 Units by mouth See admin instructions. Takes 2,000 units every day except Saturday and Sunday   . hydrochlorothiazide (HYDRODIURIL) 12.5 MG tablet Take 1 tablet (12.5 mg total) by mouth  daily.  Marland Kitchen ketoconazole (NIZORAL) 2 % shampoo Apply to scalp daily prn  . levonorgestrel-ethinyl estradiol (SETLAKIN) 0.15-0.03 MG tablet Setlakin 0.15 mg-30 mcg (91) tablets,3 month dose pack  . losartan (COZAAR) 50 MG tablet Take 1 tablet (50 mg total) by mouth daily.  . [DISCONTINUED] Homeopathic Products (OSCILLOCOCCINUM PO) Take by mouth.   No facility-administered encounter medications on file as of 03/14/2019.   :  Review of Systems:  Out of a complete 14 point review of systems, all are reviewed and negative with the exception of these symptoms as listed below: Review of Systems  Neurological:       Pt presents today to discuss her sleep. Pt has never had a sleep study but does endorse snoring.  Epworth Sleepiness Scale 0= would never doze 1= slight chance of dozing 2= moderate chance of dozing 3= high chance of dozing  Sitting and reading: 3 Watching TV: 2 Sitting inactive in a public place (ex. Theater or meeting): 2 As a passenger in a car for an hour without a break: 3 Lying down to rest in the afternoon: 3 Sitting and talking to someone: 0 Sitting quietly after lunch (no alcohol): 2 In a car, while stopped in traffic: 0 Total: 15     Objective:  Neurological Exam  Physical Exam Physical Examination:   Vitals:   03/14/19 1552  BP: (!) 132/92  Pulse: (!) 58    General Examination: The patient is a very pleasant 43 y.o. female in no acute distress. She appears well-developed and well-nourished and well groomed.   HEENT: Normocephalic, atraumatic, pupils are equal, round and reactive to light. Extraocular tracking is well Preserved, hearing is grossly intact. Face is symmetric with normal facial animation and normal facial sensation. Speech is clear with no dysarthria noted. There is no hypophonia. There is no lip, neck/head, jaw or voice tremor. Neck is supple with full range of passive and active motion. There are no carotid bruits on auscultation. Oropharynx  exam reveals: mild mouth dryness, good dental hygiene and mild airway crowding, due to Small airway entry, Tonsils 1+ bilaterally, uvula normal in size, tongue normal, tongue protrudes centrally in palate elevates symmetrically, Mallampati class II.  Neck circumference is 14 and three-quarter inches.  She has a mild to moderate overbite, nasal inspection reveals somewhat prominent inferior turbinates bilaterally, no septal deviation noted.   Chest: Clear to auscultation without wheezing, rhonchi or crackles noted.  Heart: S1+S2+0, regular and normal without murmurs, rubs or gallops noted.   Abdomen: Soft, non-tender and non-distended with normal bowel sounds appreciated on auscultation.  Extremities: There is no pitting edema in the distal lower extremities bilaterally.  Skin: Warm and dry without trophic changes noted.  Musculoskeletal: exam reveals no obvious joint deformities, tenderness or joint swelling or erythema.   Neurologically:  Mental status: The patient is awake, alert and oriented in all 4 spheres. Her immediate and remote memory, attention, language skills and fund of knowledge are appropriate. There is no evidence of aphasia, agnosia, apraxia or anomia. Speech is clear with normal prosody and enunciation. Thought process is linear. Mood is normal and affect is normal.  Cranial nerves II - XII are as described above under HEENT exam. In addition: shoulder shrug is normal with equal shoulder height noted. Motor exam: Normal bulk, strength and tone is noted. There is no drift, tremor or rebound. Romberg is negative. Fine motor skills and coordination: intact with normal finger taps, normal hand movements, normal rapid alternating patting, normal foot taps and normal foot agility.  Cerebellar testing: No dysmetria or intention tremor on finger to nose testing. Heel to shin is unremarkable bilaterally. There is no truncal or gait ataxia.  Sensory exam: intact to light touchin the upper  and lower extremities.  Gait, station and balance: She stands easily. No veering to one side is noted. No leaning to one side is noted. Posture is age-appropriate and stance is narrow based. Gait shows normal stride length and normal pace. No problems turning are noted. Tandem walk is unremarkable.   Assessment and Plan:  In summary, Chancey C Samara DeistSuddreth is a very pleasant 43 y.o.-year old female with an underlying medical history of hypertension, vitamin D deficiency, and obesity, whose history and physical exam are concerning for obstructive sleep apnea (OSA). I had a long chat with the patient about my findings and the diagnosis of OSA, its prognosis and treatment options. We talked about medical treatments, surgical interventions and non-pharmacological approaches. I explained in particular the risks and ramifications of untreated moderate to severe OSA, especially with respect to developing cardiovascular disease down the Road, including congestive heart failure, difficult to treat hypertension, cardiac arrhythmias, or stroke. Even type 2 diabetes has, in part, been linked to untreated OSA. Symptoms of untreated OSA include daytime sleepiness, memory problems, mood irritability and mood disorder such as depression and anxiety, lack of energy, as well as recurrent headaches, especially morning headaches. We talked about trying to maintain a healthy lifestyle in general, as well as the importance of weight control. I encouraged the patient to eat healthy, exercise daily and keep well hydrated, to keep a scheduled bedtime and wake time routine, to not skip any meals and eat healthy snacks in between meals. I advised the patient not to drive when feeling sleepy. I recommended the following at this time: sleep study. She would be more comfortable with a home sleep test which I ordered at this time but I did explain to her The limitation of a home sleep test. I explained the lab sleep test procedure vs HST to the  patient and also outlined possible surgical and non-surgical treatment options of OSA, including the use of a custom-made dental device (which would require a referral to a specialist dentist or oral surgeon), upper airway surgical options, such as Inspire or UPPP (which would involve a referral to an ENT surgeon).  I also explained the CPAP treatment option to the patient, who indicated that she would be willing to try CPAP if the need arises. I explained the importance of being compliant with PAP treatment, not only for insurance purposes but primarily to improve Her symptoms, and for the patient's  long term health benefit, including to reduce Her cardiovascular risks. I answered all her questions today and the patient was in agreement. I plan to see her back after the sleep study is completed and encouraged her to call with any interim questions, concerns, problems or updates.   Thank you very much for allowing me to participate in the care of this nice patient. If I can be of any further assistance to you please do not hesitate to call me at [phone].  Sincerely,   Huston FoleySaima Chanci Ojala, MD, PhD

## 2019-03-22 ENCOUNTER — Encounter: Payer: Self-pay | Admitting: Internal Medicine

## 2019-03-27 ENCOUNTER — Other Ambulatory Visit: Payer: Self-pay

## 2019-03-27 ENCOUNTER — Encounter: Payer: Self-pay | Admitting: Gynecology

## 2019-03-27 ENCOUNTER — Ambulatory Visit (INDEPENDENT_AMBULATORY_CARE_PROVIDER_SITE_OTHER): Payer: BC Managed Care – PPO

## 2019-03-27 VITALS — BP 124/86 | HR 78 | Temp 98.4°F

## 2019-03-27 DIAGNOSIS — Z23 Encounter for immunization: Secondary | ICD-10-CM

## 2019-03-27 NOTE — Progress Notes (Signed)
Pt here for flu shot today

## 2019-03-28 ENCOUNTER — Ambulatory Visit: Payer: BC Managed Care – PPO | Admitting: Neurology

## 2019-03-28 DIAGNOSIS — R0683 Snoring: Secondary | ICD-10-CM

## 2019-03-28 DIAGNOSIS — G4719 Other hypersomnia: Secondary | ICD-10-CM

## 2019-03-28 DIAGNOSIS — E669 Obesity, unspecified: Secondary | ICD-10-CM

## 2019-03-28 DIAGNOSIS — R351 Nocturia: Secondary | ICD-10-CM

## 2019-03-28 DIAGNOSIS — G4733 Obstructive sleep apnea (adult) (pediatric): Secondary | ICD-10-CM

## 2019-03-28 LAB — CMP14+EGFR
ALT: 13 IU/L (ref 0–32)
AST: 14 IU/L (ref 0–40)
Albumin/Globulin Ratio: 1.5 (ref 1.2–2.2)
Albumin: 4.4 g/dL (ref 3.8–4.8)
Alkaline Phosphatase: 64 IU/L (ref 39–117)
BUN/Creatinine Ratio: 10 (ref 9–23)
BUN: 10 mg/dL (ref 6–24)
Bilirubin Total: 0.4 mg/dL (ref 0.0–1.2)
CO2: 26 mmol/L (ref 20–29)
Calcium: 9.5 mg/dL (ref 8.7–10.2)
Chloride: 101 mmol/L (ref 96–106)
Creatinine, Ser: 1.04 mg/dL — ABNORMAL HIGH (ref 0.57–1.00)
GFR calc Af Amer: 76 mL/min/{1.73_m2} (ref >59)
GFR calc non Af Amer: 66 mL/min/{1.73_m2} (ref >59)
Globulin, Total: 2.9 g/dL (ref 1.5–4.5)
Glucose: 84 mg/dL (ref 65–99)
Potassium: 4.2 mmol/L (ref 3.5–5.2)
Sodium: 140 mmol/L (ref 134–144)
Total Protein: 7.3 g/dL (ref 6.0–8.5)

## 2019-03-28 LAB — LIPID PANEL
Chol/HDL Ratio: 2.7 ratio (ref 0.0–4.4)
Cholesterol, Total: 194 mg/dL (ref 100–199)
HDL: 72 mg/dL (ref >39)
LDL Chol Calc (NIH): 103 mg/dL — ABNORMAL HIGH (ref 0–99)
Triglycerides: 110 mg/dL (ref 0–149)
VLDL Cholesterol Cal: 19 mg/dL (ref 5–40)

## 2019-03-28 LAB — CBC
Hematocrit: 41 % (ref 34.0–46.6)
Hemoglobin: 13.2 g/dL (ref 11.1–15.9)
MCH: 25.5 pg — ABNORMAL LOW (ref 26.6–33.0)
MCHC: 32.2 g/dL (ref 31.5–35.7)
MCV: 79 fL (ref 79–97)
Platelets: 325 10*3/uL (ref 150–450)
RBC: 5.17 x10E6/uL (ref 3.77–5.28)
RDW: 13.4 % (ref 11.7–15.4)
WBC: 6.9 10*3/uL (ref 3.4–10.8)

## 2019-03-28 LAB — HEMOGLOBIN A1C
Est. average glucose Bld gHb Est-mCnc: 114 mg/dL
Hgb A1c MFr Bld: 5.6 % (ref 4.8–5.6)

## 2019-04-05 NOTE — Procedures (Signed)
Patient Information     First Name: Regina Last Name: Jennings ID: [id number]  Birth Date: 03-Dec-1975 Age: 43 Gender: Female  Referring Provider: Dorothyann Peng, MD BMI: 32.8 (W=185 lb, H=5' 3'')  Neck Circ.:  14 '' Epworth:  15/24   Sleep Study Information    Study Date: Mar 28, 2019 S/H/A Version: [id number] / 4.1.1528 / 66  History:    43 year old woman with a history of hypertension, vitamin D deficiency, and obesity, who reports snoring and excessive daytime somnolence.    Summary & Diagnosis:    Sleep disordered breathing (Mild OSA)  Recommendations:     This HST shows borderline obstructive sleep apnea, with an AHI of 6/hour and O2 nadir of 91%. Treatment with positive airway pressure can be considered with autoPAP, if desired by the patient. Treatment options otherwise include weight loss and avoidance of the supine sleep position or a dental device. These different avenues will be discussed with the patient. The patient will be seen in follow up in sleep clinic, if necessary. Please note, that other causes of the patient's symptoms, including circadian rhythm disturbances, an underlying mood disorder, medication effect and/or an underlying medical problem cannot be ruled out based on this test. Clinical correlation is recommended. The patient should be cautioned not to drive, work at heights, or operate dangerous or heavy equipment when tired or sleepy. Review and reiteration of good sleep hygiene measures should be pursued with any patient. The referring provider will be notified of the test results.   I certify that I have reviewed the raw data recording prior to the issuance of this report in accordance with the standards of the American Academy of Sleep Medicine (AASM).  Huston Foley, MD, PhD  [facility]) Diplomat, American Board of Psychiatry and Neurology Diplomat, American Board of Sleep Medicine              Sleep Summary  Oxygen  Saturation Statistics   Start Study Time: End Study Time: Total Recording Time:  11:17:05 PM 8:32:36 AM 9 h, 15 min  Total Sleep Time % REM of Sleep Time:  8 h, 31 min 34.9    Mean: 96 Minimum: 91 Maximum: 100  Mean of Desaturations Nadirs (%):   94  Oxygen Desaturation. %: 4-9 10-20 >20 Total  Events Number Total  11 100.0  0 0.0  0 0.0  11 100.0  Oxygen Saturation: <90 <=88 <85 <80 <70  Duration (minutes): Sleep % 0.0 0.0 0.0 0.0 0.0 0.0 0.0 0.0 0.0 0.0     Respiratory Indices      Total Events REM NREM All Night  pRDI:  60  pAHI:  40 ODI:  11  pAHIc:  0  % CSR: 0.0 13.1 10.9 3.6 0.0 7.9 4.7 1.1 0.0 9.0 6.0 1.7 0.0       Pulse Rate Statistics during Sleep (BPM)      Mean: 68 Minimum: 56 Maximum: 104    Indices are calculated using technically valid sleep time of  6 hrs, 39 min. Central-Indices are calculated using technically valid sleep time of  6  hrs, 37 min. pRDI/pAHI are calculated using oxi desaturations ? 3%  Body Position Statistics  Position Supine Prone Right Left Non-Supine  Sleep (min) 404.0 0.0 48.5 59.0 107.5  Sleep % 79.0 0.0 9.5 11.5 21.0  pRDI 9.2 N/A 8.9 7.4 8.0  pAHI 5.9 N/A 5.9 7.4 6.8  ODI 1.9 N/A 0.0 0.0 0.0  Snoring Statistics Snoring Level (dB) >40 >50 >60 >70 >80 >Threshold (45)  Sleep (min) 209.6 5.2 1.3 0.4 0.0 10.0  Sleep % 41.0 1.0 0.3 0.1 0.0 2.0    Mean: 41 dB Sleep Stages Chart

## 2019-04-05 NOTE — Progress Notes (Signed)
Patient referred by Dr. Baird Cancer, seen by me on 03/14/19, HST on 03/28/19.    Please call and notify the patient that the recent home sleep test showed obstructive sleep apnea. OSA is in the borderline range (AHI 6/h, O2 nadir 91%) and treatment with autoPAP or CPAP is not warranted, but we can try autoPAP, if she would like. Alternatively, she can work on wt reduction and try to sleep off her back or consider a dental device, if she prefers. Please let me know, how she would like to proceed. I would be happy to order autoPAP therapy and we can see her in FU. Star Age, MD, PhD [facility])

## 2019-04-09 ENCOUNTER — Telehealth: Payer: Self-pay

## 2019-04-09 NOTE — Telephone Encounter (Signed)
Noted, thanks!

## 2019-04-09 NOTE — Telephone Encounter (Signed)
I called pt and discussed her sleep study results and recommendations. Pt will avoid sleeping on her back. She will pursue weight loss and discus a dental device with her dentist.  A copy of this sleep study will be sent to Dr. Baird Cancer. Pt verbalized understanding of results and recommendations. Pt had no questions at this time but was encouraged to call back if questions arise.

## 2019-04-09 NOTE — Telephone Encounter (Signed)
-----   Message from Star Age, MD sent at 04/05/2019  6:34 PM EDT ----- Patient referred by Dr. Baird Cancer, seen by me on 03/14/19, HST on 03/28/19.    Please call and notify the patient that the recent home sleep test showed obstructive sleep apnea. OSA is in the borderline range (AHI 6/h, O2 nadir 91%) and treatment with autoPAP or CPAP is not warranted, but we can try autoPAP, if she would like. Alternatively, she can work on wt reduction and try to sleep off her back or consider a dental device, if she prefers. Please let me know, how she would like to proceed. I would be happy to order autoPAP therapy and we can see her in FU. Star Age, MD, PhD [facility])

## 2019-04-24 ENCOUNTER — Telehealth: Payer: Self-pay

## 2019-04-24 NOTE — Telephone Encounter (Signed)
I returned the pt's call and notified her that Dr. Baird Cancer said the pt doesn't need a pneumonia vaccination at this time.

## 2019-07-12 ENCOUNTER — Telehealth: Payer: Self-pay

## 2019-07-12 NOTE — Telephone Encounter (Signed)
Pt LVM for an appt spoke with pt she stated she had been feeling dizzy, light headed, vertigo at times and would like an appt. Pt stated she also had these symptoms back in OCT 2020 and had a telecom visit with a different provider. Pt has an upcoming appt with Dr.Sanders

## 2019-07-18 ENCOUNTER — Encounter: Payer: Self-pay | Admitting: Internal Medicine

## 2019-07-18 ENCOUNTER — Other Ambulatory Visit: Payer: Self-pay

## 2019-07-18 ENCOUNTER — Ambulatory Visit: Payer: BC Managed Care – PPO | Admitting: Internal Medicine

## 2019-07-18 VITALS — Temp 98.2°F | Ht 63.5 in | Wt 190.4 lb

## 2019-07-18 DIAGNOSIS — R42 Dizziness and giddiness: Secondary | ICD-10-CM | POA: Diagnosis not present

## 2019-07-18 DIAGNOSIS — R0981 Nasal congestion: Secondary | ICD-10-CM

## 2019-07-18 DIAGNOSIS — I10 Essential (primary) hypertension: Secondary | ICD-10-CM

## 2019-07-18 DIAGNOSIS — F5101 Primary insomnia: Secondary | ICD-10-CM

## 2019-07-18 MED ORDER — NOREL AD 4-10-325 MG PO TABS: 4.0000 mg | ORAL_TABLET | Freq: Two times a day (BID) | ORAL | 0 refills | Status: DC | PRN

## 2019-07-18 NOTE — Patient Instructions (Signed)
COVID-19 Vaccine Information can be found at: [email or web address] For questions related to vaccine distribution or appointments, please email [email or web address] or call [phone].    

## 2019-07-19 LAB — CMP14+EGFR
ALT: 13 IU/L (ref 0–32)
AST: 17 IU/L (ref 0–40)
Albumin/Globulin Ratio: 1.9 (ref 1.2–2.2)
Albumin: 4.7 g/dL (ref 3.8–4.8)
Alkaline Phosphatase: 58 IU/L (ref 39–117)
BUN/Creatinine Ratio: 13 (ref 9–23)
BUN: 13 mg/dL (ref 6–24)
Bilirubin Total: 0.2 mg/dL (ref 0.0–1.2)
CO2: 24 mmol/L (ref 20–29)
Calcium: 9.4 mg/dL (ref 8.7–10.2)
Chloride: 103 mmol/L (ref 96–106)
Creatinine, Ser: 0.99 mg/dL (ref 0.57–1.00)
GFR calc Af Amer: 81 mL/min/{1.73_m2} (ref >59)
GFR calc non Af Amer: 70 mL/min/{1.73_m2} (ref >59)
Globulin, Total: 2.5 g/dL (ref 1.5–4.5)
Glucose: 82 mg/dL (ref 65–99)
Potassium: 4.4 mmol/L (ref 3.5–5.2)
Sodium: 140 mmol/L (ref 134–144)
Total Protein: 7.2 g/dL (ref 6.0–8.5)

## 2019-07-20 ENCOUNTER — Telehealth: Payer: Self-pay

## 2019-07-20 NOTE — Telephone Encounter (Signed)
Left the patient a message to call back for lab results. 

## 2019-07-20 NOTE — Telephone Encounter (Signed)
-----   Message from Dorothyann Peng, MD sent at 07/19/2019  8:42 PM EST ----- Liver and kidney fxn are nl. How is she feeling?

## 2019-07-21 NOTE — Progress Notes (Signed)
This visit occurred during the SARS-CoV-2 public health emergency.  Safety protocols were in place, including screening questions prior to the visit, additional usage of staff PPE, and extensive cleaning of exam room while observing appropriate contact time as indicated for disinfecting solutions.  Subjective:     Patient ID: Regina Jennings , female    DOB: 1975/10/07 , 44 y.o.   MRN: [id number]   Chief Complaint  Patient presents with  . Dizziness    HPI  She is here today for further evaluation of dizziness. She reports she did e-visit with someone and she was taught how to do the Epley maneuver. She reports this somewhat helped, but she thinks her sx are returning. She denies recent URI, but does admit to sinus congestion.   Dizziness This is a new problem. The current episode started in the past 7 days. The problem occurs intermittently. The problem has been unchanged. Associated symptoms include vertigo. Pertinent negatives include no anorexia, arthralgias, joint swelling or neck pain. The symptoms are aggravated by bending. She has tried lying down for the symptoms.     Past Medical History:  Diagnosis Date  . 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 Hypertension   . Vitamin D deficiency      Family History  Problem Relation Age of Onset  . Stroke Maternal Grandmother   . Migraines Mother   . Ulcers Mother   . Hypertension Mother      Current Outpatient Medications:  .  cholecalciferol (VITAMIN D3) 25 MCG (1000 UNIT) tablet, Take 1,000 Units by mouth See admin instructions. Takes 2,000 units every day except Saturday and Sunday , Disp: , Rfl:  .  hydrochlorothiazide (HYDRODIURIL) 12.5 MG tablet, Take 1 tablet (12.5 mg total) by mouth daily., Disp: 90 tablet, Rfl: 0 .  ketoconazole (NIZORAL) 2 % shampoo, Apply to scalp daily prn, Disp: 120 mL, Rfl: 3 .  levonorgestrel-ethinyl estradiol (SETLAKIN) 0.15-0.03 MG tablet, Setlakin 0.15 mg-30 mcg (91)  tablets,3 month dose pack, Disp: , Rfl:  .  losartan (COZAAR) 50 MG tablet, Take 1 tablet (50 mg total) by mouth daily., Disp: 90 tablet, Rfl: 0 .  Chlorphen-PE-Acetaminophen (NOREL AD) 4-10-325 MG TABS, Take 4-10 mg by mouth 2 (two) times daily as needed., Disp: 20 tablet, Rfl: 0   Allergies  Allergen Reactions  . Boric Acid Hives  . Flagyl [Metronidazole]   . Sulfa Antibiotics Rash  . Xyzal [Cetirizine Hcl] Rash     Review of Systems  Constitutional: Negative.   Respiratory: Negative.   Cardiovascular: Negative.   Gastrointestinal: Negative.  Negative for anorexia.  Musculoskeletal: Negative for arthralgias, joint swelling and neck pain.  Neurological: Positive for dizziness and vertigo.  Psychiatric/Behavioral: Positive for sleep disturbance.       She c/o insomnia. She has difficulty falling and staying asleep. She admits to stress at work. She is not sure what is contributing to her sx.      Today's Vitals   07/18/19 1630  Temp: 98.2 F (36.8 C)  TempSrc: Oral  Weight: 190 lb 6.4 oz (86.4 kg)  Height: 5' 3.5" (1.613 m)   Body mass index is 33.2 kg/m.   Objective:  Physical Exam Vitals and nursing note reviewed.  Constitutional:      Appearance: Normal appearance.  HENT:     Head: Normocephalic and atraumatic.     Right Ear: Tympanic membrane, ear canal and external ear normal. There is no impacted cerumen.  Left Ear: Tympanic membrane, ear canal and external ear normal. There is no impacted cerumen.     Nose:     Comments: Deferred, masked    Mouth/Throat:     Comments: Deferred,masked Cardiovascular:     Rate and Rhythm: Normal rate and regular rhythm.     Heart sounds: Normal heart sounds.  Pulmonary:     Effort: Pulmonary effort is normal.     Breath sounds: Normal breath sounds.  Skin:    General: Skin is warm.  Neurological:     General: No focal deficit present.     Mental Status: She is alert.  Psychiatric:        Mood and Affect: Mood normal.         Behavior: Behavior normal.         Assessment And Plan:     1. Vertigo  Resolving. Orthostatics performed, pt also advised to increase her water intake. She admits that she could probably benefit from this.   2. Sinus congestion  She was given samples of Norel AD one tab to take twice daily as needed. She is encouraged to sign up for MyChart so she can email me over the weekend to let me know how she is doing.   3. Essential hypertension, benign  Chronic, fair control. She will continue with current meds. I will check renal function today.   - CMP14+EGFR  4. Primary insomnia  She has melatonin at home and will start this nightly. Also advised to take magnesium 486m nightly.  She is also encouraged to develop a bedtime routine.   RMaximino Greenland MD    THE PATIENT IS ENCOURAGED TO PRACTICE SOCIAL DISTANCING DUE TO THE COVID-19 PANDEMIC.

## 2019-07-30 ENCOUNTER — Telehealth: Payer: Self-pay

## 2019-07-30 DIAGNOSIS — R42 Dizziness and giddiness: Secondary | ICD-10-CM

## 2019-07-30 NOTE — Telephone Encounter (Signed)
The pt called and said that she wanted to let Dr.Sanders know that she took the Norel AD for 2 days and on the second day she couldn't sleep and was feeling weird during the night so she didn't take anymore and would like to get the referral that was offered if she wasn't feeling better.

## 2019-08-08 ENCOUNTER — Telehealth: Payer: Self-pay

## 2019-08-08 NOTE — Telephone Encounter (Signed)
The pt said that she went to the ENT and that she's still not feeling well, they gave her a hearing test, said that she passed that but she is still having the off balance heavy feeling.  The pt said that it feels like she has brain fog and is affecting her work.

## 2019-08-13 ENCOUNTER — Telehealth: Payer: Self-pay

## 2019-08-13 NOTE — Telephone Encounter (Signed)
The pt was told that Dr. Allyne Gee said from her understanding that the ENT is scheduling additional testing for the pt because the pt called and said that she is still having the foggy feeling in her head.  The pt said that she was told by the ENT that she needed to have her eyes checked and that she was going to ask if she needed a referral.  I told the pt no referral is needed and I gave her some recommendations that Dr .Allyne Gee uses. [facility]  and Dr. Chalmers Guest

## 2019-08-29 ENCOUNTER — Other Ambulatory Visit: Payer: Self-pay

## 2019-08-29 ENCOUNTER — Ambulatory Visit: Payer: BC Managed Care – PPO | Admitting: Neurology

## 2019-08-29 ENCOUNTER — Encounter: Payer: Self-pay | Admitting: Neurology

## 2019-08-29 VITALS — BP 122/86 | HR 80 | Temp 98.0°F | Ht 63.5 in | Wt 187.0 lb

## 2019-08-29 DIAGNOSIS — G44209 Tension-type headache, unspecified, not intractable: Secondary | ICD-10-CM | POA: Diagnosis not present

## 2019-08-29 DIAGNOSIS — R42 Dizziness and giddiness: Secondary | ICD-10-CM | POA: Diagnosis not present

## 2019-08-29 DIAGNOSIS — H538 Other visual disturbances: Secondary | ICD-10-CM | POA: Diagnosis not present

## 2019-08-29 NOTE — Patient Instructions (Signed)
I do not believe you have migraines.  You may have recurrent tension type headaches.  I do believe you Recurrent vertigo.  You can talk to your ENT nurse practitioner or Dr. About trying meclizine.  For your headaches you can try over-the-counter Aleve or Tylenol for now, we can consider a prescription preventative medication such as amitriptyline in low dose.  Your neurological exam is normal thankfully.  I do encourage you to get a formal eye examination done, you have an appointment scheduled for next month.  We will proceed with a brain MRI with and without contrast and call you with the results.  Please follow-up routinely to see the nurse practitioner in 3 months, sooner if needed.  We will call you in the interim with your MRI results and you can always call us back if you have any additional questions or concerns.

## 2019-08-29 NOTE — Progress Notes (Signed)
Subjective:    Patient ID: Regina Jennings is a 44 y.o. female.  HPI     Interim history:   Dear Regina Jennings, I saw your patient, Regina Jennings, upon your kind request in my neurologic clinic today for initial consultation of her recurrent headaches and blurry vision intermittently.  The patient is unaccompanied today.  As you know, Regina Jennings is a 44 year old right-handed woman with an underlying medical history of vertigo, hypertension, vitamin D deficiency, obesity, and mild obstructive sleep apnea, who reports intermittent foggy headedness.  She feels off balance at times with something shifting inside her head.  It is similar to her vertigo but not as pronounced.  She does have some intermittent headaches, no prior history of headaches or migraines, headache started also around the time when she first had a bout of vertigo in October 2020.  Sometimes she has a bitemporal squeezing or heaviness sensation.  She denies any sudden onset of one-sided weakness or numbness or tingling or droopy face or slurring of speech.  Her father has been on meclizine daily for vertigo as I understand.  She is wondering if she should try it.  She recently tried a pain medication through samples from her primary care physician, Norel AD, but had side effects, felt drugged.  She has no associated nausea or vomiting or photophobia or sonophobia.  She has not fallen.  She had a fleeting sense of spinning sensation recently last week.  Headaches overall are better this past week.  She typically does not try any over-the-counter medication such as Tylenol or Aleve.  She tries to hydrate well, has not been drinking any caffeine on a regular basis, lately just a little bit more because it was in the house.  She does not drink any alcohol on a regular basis, in fact, has not had any in the past 3 months. I have seen before for sleep evaluation last year.  She had a home sleep test on 03/28/2019 which indicated borderline  obstructive sleep apnea with an AHI of 6/h, O2 nadir of 91%.  I reviewed your office note from 08/03/2019.  She had a recent bout of vertigo and Epley maneuver helped.  She has had some blurry vision, she is planning to see an ophthalmologist.  She feels the need to squint at times, no double vision.  She made an appointment to see an ophthalmologist next month. She has not been sleeping very well.  Sleep is interrupted.  She attributes this to avoiding sleeping on her right side because when she started having the vertigo she had slept on the right side and now she is afraid to lay on her right side. Her head pressure feeling can be nearly daily.  It does not tend to get very intense, tends to linger around a 3 out of 10 on a pain scale.  Previously:  03/14/2019: Regina Jennings is a 44 year old right-handed woman with an underlying medical history of hypertension, vitamin D deficiency, and obesity, who reports snoring and excessive daytime somnolence.  I reviewed your office note from 02/27/2019. Her Epworth sleepiness score is 15 out of 24, fatigue severity score is 34 out of 63.  She is single and lives with her daughter, she works at [employer], as Teacher, English as a foreign language, currently works from home and a more variable sleep schedule.  Her daughter is 73 and also attends [employer]. Patient's bedtime is currently around 11, rise time around 8.  She has nocturia once per average night, she denies  any recurrent morning headaches, she is not aware of any family history of OSA.  She does not wake up rested and does not get good consolidated sleep, it seems like she consistently wakes up after the first 4 hours and then does not get any good deep sleep afterwards.  She is working on weight loss.  She is a non-smoker and drinks alcohol occasionally, no daily caffeine either. Lately, she has noted the need to take some deeper breaths when she is awake.  She feels like she does not always breathe deeply enough.    Her Past Medical  History Is Significant For: Past Medical History:  Diagnosis Date  . 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 Hypertension   . Vitamin D deficiency     Her Past Surgical History Is Significant For: Past Surgical History:  Procedure Laterality Date  . WISDOM TOOTH EXTRACTION      Her Family History Is Significant For: Family History  Problem Relation Age of Onset  . Stroke Maternal Grandmother   . Migraines Mother   . Ulcers Mother   . Hypertension Mother     Her Social History Is Significant For: Social History   Socioeconomic History  . Marital status: Single    Spouse name: Not on file  . Number of children: Not on file  . Years of education: Not on file  . Highest education level: Not on file  Occupational History  . Not on file  Tobacco Use  . Smoking status: Never Smoker  . Smokeless tobacco: Never Used  Substance and Sexual Activity  . Alcohol use: Yes  . Drug use: No  . Sexual activity: Yes    Birth control/protection: Pill    Comment: JOLESSA  Other Topics Concern  . Not on file  Social History Narrative  . Not on file   Social Determinants of Health   Financial Resource Strain:   . Difficulty of Paying Living Expenses: Not on file  Food Insecurity:   . Worried About Programme researcher, broadcasting/film/video in the Last Year: Not on file  . Ran Out of Food in the Last Year: Not on file  Transportation Needs:   . Lack of Transportation (Medical): Not on file  . Lack of Transportation (Non-Medical): Not on file  Physical Activity:   . Days of Exercise per Week: Not on file  . Minutes of Exercise per Session: Not on file  Stress:   . Feeling of Stress : Not on file  Social Connections:   . Frequency of Communication with Friends and Family: Not on file  . Frequency of Social Gatherings with Friends and Family: Not on file  . Attends Religious Services: Not on file  . Active Member of Clubs or Organizations: Not on file  . Attends Tax inspector Meetings: Not on file  . Marital Status: Not on file    Her Allergies Are:  Allergies  Allergen Reactions  . Boric Acid Hives  . Flagyl [Metronidazole]   . Sulfa Antibiotics Rash  . Xyzal [Cetirizine Hcl] Rash  :   Her Current Medications Are:  Outpatient Encounter Medications as of 08/29/2019  Medication Sig  . cholecalciferol (VITAMIN D3) 25 MCG (1000 UNIT) tablet Take 1,000 Units by mouth See admin instructions. Takes 2,000 units every day  . hydrochlorothiazide (HYDRODIURIL) 12.5 MG tablet Take 1 tablet (12.5 mg total) by mouth daily.  Marland Kitchen ketoconazole (NIZORAL) 2 % shampoo Apply to scalp daily  prn  . levonorgestrel-ethinyl estradiol (SETLAKIN) 0.15-0.03 MG tablet Setlakin 0.15 mg-30 mcg (91) tablets,3 month dose pack  . losartan (COZAAR) 50 MG tablet Take 1 tablet (50 mg total) by mouth daily.  . [DISCONTINUED] Chlorphen-PE-Acetaminophen (NOREL AD) 4-10-325 MG TABS Take 4-10 mg by mouth 2 (two) times daily as needed.   No facility-administered encounter medications on file as of 08/29/2019.  :  Review of Systems:  Out of a complete 14 point review of systems, all are reviewed and negative with the exception of these symptoms as listed below:  Review of Systems  Neurological:       Pt reports head feeling foggy and heavy throughout the day. Also reports some blurry vision through out the day. PCP requested she she a neurologist.     Objective:  Neurological Exam  Physical Exam Physical Examination:   Vitals:   08/29/19 1133  BP: 122/86  Pulse: 80  Temp: 98 F (36.7 C)   General Examination: The patient is a very pleasant 44 y.o. female in no acute distress. She appears well-developed and well-nourished and well groomed.   HEENT: Normocephalic, atraumatic, pupils are equal, round and reactive to light. Extraocular tracking is well Preserved, hearing is grossly intact. Funduscopic exam is unremarkable, pupils react to accommodation as well. Face is  symmetric with normal facial animation and normal facial sensation. Speech is clear with no dysarthria noted. There is no hypophonia. There is no lip, neck/head, jaw or voice tremor. Neck is supple with full range of passive and active motion. There are no carotid bruits on auscultation. Oropharynx exam reveals: mild mouth dryness, good dental hygiene and mild airway crowding, tongue protrudes centrally in palate elevates symmetrically.   Chest: Clear to auscultation without wheezing, rhonchi or crackles noted.  Heart: S1+S2+0, regular and normal without murmurs, rubs or gallops noted.   Abdomen: Soft, non-tender and non-distended with normal bowel sounds appreciated on auscultation.  Extremities: There is no pitting edema in the distal lower extremities bilaterally.  Skin: Warm and dry without trophic changes noted.  Musculoskeletal: exam reveals no obvious joint deformities, tenderness or joint swelling or erythema.   Neurologically:  Mental status: The patient is awake, alert and oriented in all 4 spheres. Her immediate and remote memory, attention, language skills and fund of knowledge are appropriate. There is no evidence of aphasia, agnosia, apraxia or anomia. Speech is clear with normal prosody and enunciation. Thought process is linear. Mood is normal and affect is normal.  Cranial nerves II - XII are as described above under HEENT exam. In addition: shoulder shrug is normal with equal shoulder height noted. Motor exam: Normal bulk, strength and tone is noted. There is no drift, tremor or rebound.Reflexes are 2+, toes are downgoing. Romberg is negative. Fine motor skills and coordination: intact with normal finger taps, normal hand movements, normal rapid alternating patting, normal foot taps and normal foot agility.  Cerebellar testing: No dysmetria or intention tremor on finger to nose testing. Heel to shin is unremarkable bilaterally. There is no truncal or gait ataxia.  Sensory  exam: intact to light touch, temp and vibration in the upper and lower extremities.  Gait, station and balance: She stands easily. No veering to one side is noted. No leaning to one side is noted. Posture is age-appropriate and stance is narrow based. Gait shows normal stride length and normal pace. No problems turning are noted. Tandem walk is unremarkable.   Assessment and Plan:  In summary, Regina Jennings is a  very pleasant 44 year old female with an underlying medical history of hypertension, vitamin D deficiency, Vertigo and mild obesity, who presents for evaluation of her recurrent headaches and blurry vision.  Her examination is normal.  She has had recurrent vertiginous spells.  This could be in part related to her feeling of something shifting inside her head.  She is reassured that her exam is normal.  Nevertheless, a formal eye examination would help and she is advised to keep her appointment next month to see the ophthalmologist. We will proceed with a brain MRI with and without contrast to rule out a structural cause of her symptoms especially since she does not have a migraine history and her description is not migrainous.  She may have recurrent tension headaches, she has not been sleeping very well which could also be a contributor to her recurrent headaches.For now, she is advised to try over-the-counter Advil, Aleve or Tylenol for the headache.  We can consider a preventative such as low-dose amitriptyline at bedtime which can potentially also help with her sleep disruption.  She is agreeable to proceeding with MRI first.  We will call her with her test results.  She is encouraged to follow-up routinely to see one of our nurse practitioners in 3 months, we can certainly consider the amitriptyline in the interim starting at 25 mg strength half a pill nightly with gradual increase if need be. She is advised to stay well-hydrated with water. I answered all her questions today and she was in  agreement with the plan.

## 2019-08-30 ENCOUNTER — Ambulatory Visit: Payer: BC Managed Care – PPO | Admitting: Internal Medicine

## 2019-09-05 ENCOUNTER — Telehealth: Payer: Self-pay | Admitting: Neurology

## 2019-09-05 NOTE — Telephone Encounter (Signed)
spoke to patient due to the cost she is going to think about it  [name]: [id number] (exp. 09/05/19 to 03/02/20)

## 2019-09-20 ENCOUNTER — Ambulatory Visit: Payer: Self-pay

## 2019-09-20 ENCOUNTER — Ambulatory Visit: Payer: Self-pay | Attending: Family

## 2019-09-20 DIAGNOSIS — Z23 Encounter for immunization: Secondary | ICD-10-CM

## 2019-09-20 NOTE — Progress Notes (Signed)
   Covid-19 Vaccination Clinic  Name:  Regina Jennings    MRN: [id number] DOB: 12-23-1975  09/20/2019  [name] was observed post Covid-19 immunization for 15 minutes without incident. She was provided with Vaccine Information Sheet and instruction to access the V-Safe system.   [name] was instructed to call 911 with any severe reactions post vaccine: Marland Kitchen Difficulty breathing  . Swelling of face and throat  . A fast heartbeat  . A bad rash all over body  . Dizziness and weakness   Immunizations Administered    Name Date Dose VIS Date Route   Moderna COVID-19 Vaccine 09/20/2019  4:25 PM 0.5 mL 06/05/2019 Intramuscular   Manufacturer: Moderna   Lot: [id number]   NDC: 47158-063-86

## 2019-10-02 ENCOUNTER — Other Ambulatory Visit: Payer: Self-pay

## 2019-10-02 ENCOUNTER — Ambulatory Visit: Payer: BC Managed Care – PPO | Admitting: Internal Medicine

## 2019-10-02 ENCOUNTER — Encounter: Payer: Self-pay | Admitting: Internal Medicine

## 2019-10-02 VITALS — BP 142/80 | HR 78 | Temp 98.5°F | Ht 62.6 in | Wt 188.2 lb

## 2019-10-02 DIAGNOSIS — F5101 Primary insomnia: Secondary | ICD-10-CM | POA: Diagnosis not present

## 2019-10-02 DIAGNOSIS — I10 Essential (primary) hypertension: Secondary | ICD-10-CM

## 2019-10-02 DIAGNOSIS — R42 Dizziness and giddiness: Secondary | ICD-10-CM | POA: Diagnosis not present

## 2019-10-02 DIAGNOSIS — Z6833 Body mass index (BMI) 33.0-33.9, adult: Secondary | ICD-10-CM

## 2019-10-02 DIAGNOSIS — E6609 Other obesity due to excess calories: Secondary | ICD-10-CM | POA: Diagnosis not present

## 2019-10-02 MED ORDER — MECLIZINE HCL 25 MG PO TABS
25.0000 mg | ORAL_TABLET | Freq: Three times a day (TID) | ORAL | 0 refills | Status: DC | PRN
Start: 2019-10-02 — End: 2020-04-01

## 2019-10-02 MED ORDER — HYDROCHLOROTHIAZIDE 12.5 MG PO TABS: 12.5000 mg | ORAL_TABLET | Freq: Every day | ORAL | 1 refills | Status: DC

## 2019-10-02 MED ORDER — LOSARTAN POTASSIUM 50 MG PO TABS: 50.0000 mg | ORAL_TABLET | Freq: Every day | ORAL | 1 refills | Status: DC

## 2019-10-02 NOTE — Progress Notes (Signed)
This visit occurred during the SARS-CoV-2 public health emergency.  Safety protocols were in place, including screening questions prior to the visit, additional usage of staff PPE, and extensive cleaning of exam room while observing appropriate contact time as indicated for disinfecting solutions.  Subjective:     Patient ID: Regina Jennings , female    DOB: 1976/04/12 , 44 y.o.   MRN: [id number]   Chief Complaint  Patient presents with  . Hypertension    HPI  She presents for BP check today. Reports compliance with meds. States BP controlled at other MD offices. Reports she did not sleep well last night. She had a hectic day at work AND there is something wrong with her daughter's car. She is surprised t see her BP is this high today.   Hypertension This is a chronic problem. The current episode started more than 1 year ago. The problem has been gradually improving since onset. The problem is controlled. Pertinent negatives include no blurred vision, chest pain, palpitations or shortness of breath. Past treatments include angiotensin blockers.     Past Medical History:  Diagnosis Date  . 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 Hypertension   . Vitamin D deficiency      Family History  Problem Relation Age of Onset  . Stroke Maternal Grandmother   . Migraines Mother   . Ulcers Mother   . Hypertension Mother      Current Outpatient Medications:  .  cholecalciferol (VITAMIN D3) 25 MCG (1000 UNIT) tablet, Take 1,000 Units by mouth See admin instructions. Takes 2,000 units every day, Disp: , Rfl:  .  hydrochlorothiazide (HYDRODIURIL) 12.5 MG tablet, Take 1 tablet (12.5 mg total) by mouth daily., Disp: 90 tablet, Rfl: 0 .  ketoconazole (NIZORAL) 2 % shampoo, Apply to scalp daily prn, Disp: 120 mL, Rfl: 3 .  levonorgestrel-ethinyl estradiol (SETLAKIN) 0.15-0.03 MG tablet, Setlakin 0.15 mg-30 mcg (91) tablets,3 month dose pack, Disp: , Rfl:  .  losartan (COZAAR)  50 MG tablet, Take 1 tablet (50 mg total) by mouth daily., Disp: 90 tablet, Rfl: 0   Allergies  Allergen Reactions  . Boric Acid Hives  . Flagyl [Metronidazole]   . Sulfa Antibiotics Rash  . Xyzal [Cetirizine Hcl] Rash     Review of Systems  Constitutional: Negative.   Eyes: Negative for blurred vision.  Respiratory: Negative.  Negative for shortness of breath.   Cardiovascular: Negative.  Negative for chest pain and palpitations.  Gastrointestinal: Negative.   Neurological: Positive for dizziness.       She is still having issues with vertigo. She has been evaluated by ENT, then Neuro. She was scheduled for MRI, but has yet to schedule due to the cost.   Psychiatric/Behavioral: Negative.      Today's Vitals   10/02/19 1543 10/02/19 1601  BP: (!) 148/94 (!) 142/80  Pulse: 78   Temp: 98.5 F (36.9 C)   Weight: 188 lb 3.2 oz (85.4 kg)   Height: 5' 2.6" (1.59 m)    Body mass index is 33.77 kg/m.   BP Readings from Last 3 Encounters:  10/02/19 (!) 142/80  08/29/19 122/86  03/27/19 124/86   Wt Readings from Last 3 Encounters:  10/02/19 188 lb 3.2 oz (85.4 kg)  08/29/19 187 lb (84.8 kg)  07/18/19 190 lb 6.4 oz (86.4 kg)      Objective:  Physical Exam Vitals and nursing note reviewed.  Constitutional:  Appearance: Normal appearance.  HENT:     Head: Normocephalic and atraumatic.  Cardiovascular:     Rate and Rhythm: Normal rate and regular rhythm.     Heart sounds: Normal heart sounds.  Pulmonary:     Effort: Pulmonary effort is normal.     Breath sounds: Normal breath sounds.  Skin:    General: Skin is warm.  Neurological:     General: No focal deficit present.     Mental Status: She is alert.  Psychiatric:        Mood and Affect: Mood normal.        Behavior: Behavior normal.         Assessment And Plan:     1. Essential hypertension, benign  Chronic, fair control. She will continue with current meds for now. She has several reasons why her BP  would be elevated today. I will check renal function today. She is encouraged to incorporate more exercise into her daily routine.   - BMP8+EGFR  2. Primary insomnia  Chronic, persistent. She has yet to take Calm, magnesium supplement. Additionally,she has purchased melatonin, but admits she has not taken it consistently. Advised to practice good bedtime hygiene and to use magnesium/melatonin as needed.   3. Vertigo  She was given rx meclizine to take prn.   4. Class 1 obesity due to excess calories with serious comorbidity and body mass index (BMI) of 33.0 to 33.9 in adult  She is encouraged to strive for BMI less than 30 to decrease cardiac risk. Importance of regular exercise was discussed with the patient.    Maximino Greenland, MD    THE PATIENT IS ENCOURAGED TO PRACTICE SOCIAL DISTANCING DUE TO THE COVID-19 PANDEMIC.

## 2019-10-03 LAB — BMP8+EGFR
BUN/Creatinine Ratio: 12 (ref 9–23)
BUN: 12 mg/dL (ref 6–24)
CO2: 21 mmol/L (ref 20–29)
Calcium: 10.1 mg/dL (ref 8.7–10.2)
Chloride: 100 mmol/L (ref 96–106)
Creatinine, Ser: 1.02 mg/dL — ABNORMAL HIGH (ref 0.57–1.00)
GFR calc Af Amer: 78 mL/min/{1.73_m2} (ref >59)
GFR calc non Af Amer: 68 mL/min/{1.73_m2} (ref >59)
Glucose: 71 mg/dL (ref 65–99)
Potassium: 4.3 mmol/L (ref 3.5–5.2)
Sodium: 138 mmol/L (ref 134–144)

## 2019-10-11 ENCOUNTER — Other Ambulatory Visit: Payer: Self-pay | Admitting: Internal Medicine

## 2019-10-23 ENCOUNTER — Ambulatory Visit: Payer: Self-pay | Attending: Family

## 2019-10-23 DIAGNOSIS — Z23 Encounter for immunization: Secondary | ICD-10-CM

## 2019-10-23 NOTE — Progress Notes (Signed)
   Covid-19 Vaccination Clinic  Name:  Regina Jennings    MRN: [id number] DOB: 1976-02-23  10/23/2019  [name] was observed post Covid-19 immunization for 15 minutes without incident. She was provided with Vaccine Information Sheet and instruction to access the V-Safe system.   [name] was instructed to call 911 with any severe reactions post vaccine: Marland Kitchen Difficulty breathing  . Swelling of face and throat  . A fast heartbeat  . A bad rash all over body  . Dizziness and weakness   Immunizations Administered    Name Date Dose VIS Date Route   Moderna COVID-19 Vaccine 10/23/2019  3:59 PM 0.5 mL 06/2019 Intramuscular   Manufacturer: Moderna   Lot: [id number]   NDC: 42353-614-43

## 2019-11-26 ENCOUNTER — Telehealth: Payer: Self-pay | Admitting: Family Medicine

## 2019-11-26 NOTE — Telephone Encounter (Signed)
During reminder call for 05/25 appointment pt stated that she did not need official appointment but would like for the nurse to call her to talk over concerns. Please advise.

## 2019-11-26 NOTE — Telephone Encounter (Signed)
I called pt. She has questions about the cost of the MRI and if there is a payment plan available. I advised her that I will ask Irving Burton to call her. I advised pt to let us know if she cannot proceed with the MRI due to cost and we could place her back on the schedule to discuss her headaches and recommendations further. Pt verbalized understanding.

## 2019-11-26 NOTE — Telephone Encounter (Signed)
Phone rep checked office voicemail's, at 1:43 pt left message stating she was returning the call to Sunray, Charity fundraiser. Pt can be reached at [phone]

## 2019-11-26 NOTE — Telephone Encounter (Signed)
I called pt to discuss. No answer, left a message asking her to call me back. 

## 2019-11-27 ENCOUNTER — Ambulatory Visit: Payer: BC Managed Care – PPO | Admitting: Family Medicine

## 2019-11-27 NOTE — Telephone Encounter (Signed)
I spoke to the patient and due to the cost she is going to think about it and get back to me. I did offer her the payment plan.

## 2020-01-08 ENCOUNTER — Telehealth: Payer: Self-pay

## 2020-01-08 ENCOUNTER — Other Ambulatory Visit: Payer: Self-pay

## 2020-01-08 DIAGNOSIS — I10 Essential (primary) hypertension: Secondary | ICD-10-CM

## 2020-01-08 DIAGNOSIS — E6609 Other obesity due to excess calories: Secondary | ICD-10-CM

## 2020-01-08 NOTE — Telephone Encounter (Signed)
The pt was notified that dr sanders placed a referral for a nutritionist at the pt's request.

## 2020-01-08 NOTE — Telephone Encounter (Signed)
The pt was notified that I spoke with Tawanda in referral and was told that the pt would need to call her insurance company to see who they are in network with for a nutritionist because the pt said that her insurance covers it if it's preventative.  The pt said that she would call her insurance and call the office back.

## 2020-01-08 NOTE — Telephone Encounter (Signed)
Please place referral toNutrition, dx: htn, obesity

## 2020-01-08 NOTE — Telephone Encounter (Signed)
The pt called and said that she would like a referral to a nutritionist because her health and because her insurance will allow it.  The pt was told that she would need an appt and the pt said that she didn't realize she would need an appt because the office has all her history.  The pt was told that I would let Dr Allyne Gee know what she said.

## 2020-01-08 NOTE — Telephone Encounter (Signed)
The pt called and said that she needed her nutrition referral to be placed as preventative and that [facility] doesn't bill it like that so she needed a referral somewhere else that does.

## 2020-03-04 ENCOUNTER — Encounter: Payer: BC Managed Care – PPO | Admitting: Internal Medicine

## 2020-04-01 ENCOUNTER — Encounter: Payer: Self-pay | Admitting: Internal Medicine

## 2020-04-01 ENCOUNTER — Other Ambulatory Visit: Payer: Self-pay

## 2020-04-01 ENCOUNTER — Telehealth: Payer: Self-pay

## 2020-04-01 ENCOUNTER — Ambulatory Visit: Payer: BC Managed Care – PPO | Admitting: Internal Medicine

## 2020-04-01 VITALS — BP 126/84 | HR 67 | Temp 98.3°F | Ht 62.6 in | Wt 191.2 lb

## 2020-04-01 DIAGNOSIS — E559 Vitamin D deficiency, unspecified: Secondary | ICD-10-CM | POA: Diagnosis not present

## 2020-04-01 DIAGNOSIS — Z23 Encounter for immunization: Secondary | ICD-10-CM | POA: Diagnosis not present

## 2020-04-01 DIAGNOSIS — R3129 Other microscopic hematuria: Secondary | ICD-10-CM

## 2020-04-01 DIAGNOSIS — F5101 Primary insomnia: Secondary | ICD-10-CM | POA: Diagnosis not present

## 2020-04-01 DIAGNOSIS — I10 Essential (primary) hypertension: Secondary | ICD-10-CM | POA: Diagnosis not present

## 2020-04-01 DIAGNOSIS — Z0000 Encounter for general adult medical examination without abnormal findings: Secondary | ICD-10-CM | POA: Diagnosis not present

## 2020-04-01 DIAGNOSIS — E6609 Other obesity due to excess calories: Secondary | ICD-10-CM

## 2020-04-01 DIAGNOSIS — Z6834 Body mass index (BMI) 34.0-34.9, adult: Secondary | ICD-10-CM

## 2020-04-01 LAB — POCT URINALYSIS DIPSTICK
Bilirubin, UA: NEGATIVE
Glucose, UA: NEGATIVE
Ketones, UA: NEGATIVE
Leukocytes, UA: NEGATIVE
Nitrite, UA: NEGATIVE
Protein, UA: NEGATIVE
Spec Grav, UA: 1.025 (ref 1.010–1.025)
Urobilinogen, UA: 1 E.U./dL
pH, UA: 7 (ref 5.0–8.0)

## 2020-04-01 LAB — POCT UA - MICROALBUMIN
Albumin/Creatinine Ratio, Urine, POC: <30
Creatinine, POC: 100 mg/dL
Microalbumin Ur, POC: 10 mg/L

## 2020-04-01 MED ORDER — LOSARTAN POTASSIUM-HCTZ 50-12.5 MG PO TABS: 1.0000 | ORAL_TABLET | Freq: Every day | ORAL | 2 refills | Status: DC

## 2020-04-01 NOTE — Progress Notes (Signed)
I,Katawbba Wiggins,acting as a Education administrator for Maximino Greenland, MD.,have documented all relevant documentation on the behalf of Maximino Greenland, MD,as directed by  Maximino Greenland, MD while in the presence of Maximino Greenland, MD.  This visit occurred during the SARS-CoV-2 public health emergency.  Safety protocols were in place, including screening questions prior to the visit, additional usage of staff PPE, and extensive cleaning of exam room while observing appropriate contact time as indicated for disinfecting solutions.  Subjective:     Patient ID: Regina Jennings , female    DOB: 12-08-1975 , 45 y.o.   MRN: [id number]   Chief Complaint  Patient presents with  . Annual Exam  . Hypertension    HPI  She is here today for a full physical examination.  She is followed by Earnstine Regal, PA for her GYN exams.  She was seen for her pelvic exam earlier this year. She also receives her mammograms at [facility] as well.   Hypertension This is a chronic problem. The current episode started more than 1 year ago. The problem has been gradually improving since onset. The problem is controlled. Pertinent negatives include no blurred vision, palpitations or shortness of breath. Risk factors for coronary artery disease include sedentary lifestyle. Past treatments include angiotensin blockers and diuretics. The current treatment provides moderate improvement. Compliance problems include exercise.      Past Medical History:  Diagnosis Date  . 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 Hypertension   . Vitamin D deficiency      Family History  Problem Relation Age of Onset  . Stroke Maternal Grandmother   . Migraines Mother   . Ulcers Mother   . Hypertension Mother   . Hyperlipidemia Mother   . Irritable bowel syndrome Mother   . GER disease Mother      Current Outpatient Medications:  .  cholecalciferol (VITAMIN D3) 25 MCG (1000 UNIT) tablet, Take 1,000 Units by mouth See admin  instructions. Takes 2,000 units every day, Disp: , Rfl:  .  ketoconazole (NIZORAL) 2 % shampoo, Apply to scalp daily prn, Disp: 120 mL, Rfl: 3 .  levonorgestrel-ethinyl estradiol (SETLAKIN) 0.15-0.03 MG tablet, Setlakin 0.15 mg-30 mcg (91) tablets,3 month dose pack, Disp: , Rfl:  .  Multiple Vitamins-Minerals (WOMENS DAILY FORMULA PO), Take by mouth. 1 daily, Disp: , Rfl:  .  losartan-hydrochlorothiazide (HYZAAR) 50-12.5 MG tablet, Take 1 tablet by mouth daily., Disp: 90 tablet, Rfl: 2   Allergies  Allergen Reactions  . Boric Acid Hives  . Flagyl [Metronidazole]   . Sulfa Antibiotics Rash  . Xyzal [Cetirizine Hcl] Rash      The patient states she uses OCP (estrogen/progesterone) for birth control. Last LMP was Patient's last menstrual period was 02/18/2020.. Negative for Dysmenorrhea. Negative for: breast discharge, breast lump(s), breast pain and breast self exam. Associated symptoms include abnormal vaginal bleeding. Pertinent negatives include abnormal bleeding (hematology), anxiety, decreased libido, depression, difficulty falling sleep, dyspareunia, history of infertility, nocturia, sexual dysfunction, sleep disturbances, urinary incontinence, urinary urgency, vaginal discharge and vaginal itching. Diet regular.The patient states her exercise level is  intermittent.  . The patient's tobacco use is:  Social History   Tobacco Use  Smoking Status Never Smoker  Smokeless Tobacco Never Used  . She has been exposed to passive smoke. The patient's alcohol use is:  Social History   Substance and Sexual Activity  Alcohol Use Yes   Review of Systems  Constitutional: Negative.  HENT: Negative.   Eyes: Negative.  Negative for blurred vision.  Respiratory: Negative.  Negative for shortness of breath.   Cardiovascular: Negative.  Negative for palpitations.  Endocrine: Negative.   Genitourinary: Negative.   Musculoskeletal: Negative.   Skin: Negative.   Allergic/Immunologic: Negative.    Neurological: Negative.   Hematological: Negative.   Psychiatric/Behavioral: Positive for sleep disturbance.       C/o issues with insomnia. No issues with falling asleep, has issues with staying asleep.      Today's Vitals   04/01/20 0931  BP: 126/84  Pulse: 67  Temp: 98.3 F (36.8 C)  TempSrc: Oral  Weight: 191 lb 3.2 oz (86.7 kg)  Height: 5' 2.6" (1.59 m)   Body mass index is 34.3 kg/m.  Wt Readings from Last 3 Encounters:  04/01/20 191 lb 3.2 oz (86.7 kg)  10/02/19 188 lb 3.2 oz (85.4 kg)  08/29/19 187 lb (84.8 kg)   Objective:  Physical Exam Vitals and nursing note reviewed.  Constitutional:      Appearance: Normal appearance. She is obese.  HENT:     Head: Normocephalic and atraumatic.     Right Ear: Tympanic membrane, ear canal and external ear normal.     Left Ear: Tympanic membrane, ear canal and external ear normal.     Nose:     Comments: Deferred, masked    Mouth/Throat:     Pharynx: No posterior oropharyngeal erythema.     Comments: Deferred, masked Eyes:     Extraocular Movements: Extraocular movements intact.     Conjunctiva/sclera: Conjunctivae normal.     Pupils: Pupils are equal, round, and reactive to light.  Cardiovascular:     Rate and Rhythm: Normal rate and regular rhythm.     Pulses: Normal pulses.     Heart sounds: Normal heart sounds.  Pulmonary:     Effort: Pulmonary effort is normal.     Breath sounds: Normal breath sounds.  Chest:     Breasts: Tanner Score is 5.        Right: Normal.        Left: Normal.  Abdominal:     General: Bowel sounds are normal.     Palpations: Abdomen is soft.     Comments: Rounded, soft.   Genitourinary:    Comments: deferred Musculoskeletal:        General: Normal range of motion.     Cervical back: Normal range of motion and neck supple.  Skin:    General: Skin is warm and dry.  Neurological:     General: No focal deficit present.     Mental Status: She is alert and oriented to person, place,  and time.  Psychiatric:        Mood and Affect: Mood normal.        Behavior: Behavior normal.         Assessment And Plan:     1. Routine general medical examination at health care facility Comments: A full exam was performed. Importance of monthly self breast exams was discussed with the patient. PATIENT IS ADVISED TO GET 30-45 MINUTES REGULAR EXERCISE NO LESS THAN FOUR TO FIVE DAYS PER WEEK - BOTH WEIGHTBEARING EXERCISES AND AEROBIC ARE RECOMMENDED.  PATIENT IS ADVISED TO FOLLOW A HEALTHY DIET WITH AT LEAST SIX FRUITS/VEGGIES PER DAY, DECREASE INTAKE OF RED MEAT, AND TO INCREASE FISH INTAKE TO TWO DAYS PER WEEK.  MEATS/FISH SHOULD NOT BE FRIED, BAKED OR BROILED IS PREFERABLE.  I SUGGEST WEARING SPF  50 SUNSCREEN ON EXPOSED PARTS AND ESPECIALLY WHEN IN THE DIRECT SUNLIGHT FOR AN EXTENDED PERIOD OF TIME.  PLEASE AVOID FAST FOOD RESTAURANTS AND INCREASE YOUR WATER INTAKE.  - CMP14+EGFR - CBC - Lipid panel - Hepatitis C antibody  2. Essential hypertension, benign Comments: Chronic, well controlled. She will continue with current meds. She is encouraged to avoid adding salt to her foods. EKG performed, NSR w/o acute changes.  She will rto in six months for re-evaluation.  - POCT Urinalysis Dipstick (81002) - POCT UA - Microalbumin - EKG 12-Lead  3. Primary insomnia Comments: Encouraged to develop bedtime routine. Advised to try powdered magnesium. Will add melatonin if needed.  4. Vitamin D deficiency disease Comments: I will check a vitamin D level and supplement as needed.  - Vitamin D (25 hydroxy)  5. Microscopic hematuria Comments: I will schedule her for renal ultrasound. She is in agreement with her treatment plan.  - US Renal; Future  6. Class 1 obesity due to excess calories with serious comorbidity and body mass index (BMI) of 34.0 to 34.9 in adult Comments: She is encouraged to strive for BMI less than 30 to decrease cardiac risk. Advised to incorporate more exercise into  her daily routine. I will also refer her to [facility] for their weight management program.  - Ambulatory referral to Internal Medicine  7. Need for vaccination Comments: She was given flu vaccine.  Advised to wait at least 2 weeks to get her COVID booster if she decides to get one.  - Flu Vaccine QUAD 6+ mos PF IM (Fluarix Quad PF)     Patient was given opportunity to ask questions. Patient verbalized understanding of the plan and was able to repeat key elements of the plan. All questions were answered to their satisfaction.   Maximino Greenland, MD   I, Maximino Greenland, MD, have reviewed all documentation for this visit. The documentation on 04/01/20 for the exam, diagnosis, procedures, and orders are all accurate and complete.  THE PATIENT IS ENCOURAGED TO PRACTICE SOCIAL DISTANCING DUE TO THE COVID-19 PANDEMIC.

## 2020-04-01 NOTE — Patient Instructions (Signed)
Health Maintenance, Female Adopting a healthy lifestyle and getting preventive care are important in promoting health and wellness. Ask your health care provider about:  The right schedule for you to have regular tests and exams.  Things you can do on your own to prevent diseases and keep yourself healthy. What should I know about diet, weight, and exercise? Eat a healthy diet   Eat a diet that includes plenty of vegetables, fruits, low-fat dairy products, and lean protein.  Do not eat a lot of foods that are high in solid fats, added sugars, or sodium. Maintain a healthy weight Body mass index (BMI) is used to identify weight problems. It estimates body fat based on height and weight. Your health care provider can help determine your BMI and help you achieve or maintain a healthy weight. Get regular exercise Get regular exercise. This is one of the most important things you can do for your health. Most adults should:  Exercise for at least 150 minutes each week. The exercise should increase your heart rate and make you sweat (moderate-intensity exercise).  Do strengthening exercises at least twice a week. This is in addition to the moderate-intensity exercise.  Spend less time sitting. Even light physical activity can be beneficial. Watch cholesterol and blood lipids Have your blood tested for lipids and cholesterol at 44 years of age, then have this test every 5 years. Have your cholesterol levels checked more often if:  Your lipid or cholesterol levels are high.  You are older than 44 years of age.  You are at high risk for heart disease. What should I know about cancer screening? Depending on your health history and family history, you may need to have cancer screening at various ages. This may include screening for:  Breast cancer.  Cervical cancer.  Colorectal cancer.  Skin cancer.  Lung cancer. What should I know about heart disease, diabetes, and high blood  pressure? Blood pressure and heart disease  High blood pressure causes heart disease and increases the risk of stroke. This is more likely to develop in people who have high blood pressure readings, are of African descent, or are overweight.  Have your blood pressure checked: ? Every 3-5 years if you are 18-39 years of age. ? Every year if you are 40 years old or older. Diabetes Have regular diabetes screenings. This checks your fasting blood sugar level. Have the screening done:  Once every three years after age 40 if you are at a normal weight and have a low risk for diabetes.  More often and at a younger age if you are overweight or have a high risk for diabetes. What should I know about preventing infection? Hepatitis B If you have a higher risk for hepatitis B, you should be screened for this virus. Talk with your health care provider to find out if you are at risk for hepatitis B infection. Hepatitis C Testing is recommended for:  Everyone born from 1945 through 1965.  Anyone with known risk factors for hepatitis C. Sexually transmitted infections (STIs)  Get screened for STIs, including gonorrhea and chlamydia, if: ? You are sexually active and are younger than 44 years of age. ? You are older than 44 years of age and your health care provider tells you that you are at risk for this type of infection. ? Your sexual activity has changed since you were last screened, and you are at increased risk for chlamydia or gonorrhea. Ask your health care provider if   you are at risk.  Ask your health care provider about whether you are at high risk for HIV. Your health care provider may recommend a prescription medicine to help prevent HIV infection. If you choose to take medicine to prevent HIV, you should first get tested for HIV. You should then be tested every 3 months for as long as you are taking the medicine. Pregnancy  If you are about to stop having your period (premenopausal) and  you may become pregnant, seek counseling before you get pregnant.  Take 400 to 800 micrograms (mcg) of folic acid every day if you become pregnant.  Ask for birth control (contraception) if you want to prevent pregnancy. Osteoporosis and menopause Osteoporosis is a disease in which the bones lose minerals and strength with aging. This can result in bone fractures. If you are 65 years old or older, or if you are at risk for osteoporosis and fractures, ask your health care provider if you should:  Be screened for bone loss.  Take a calcium or vitamin D supplement to lower your risk of fractures.  Be given hormone replacement therapy (HRT) to treat symptoms of menopause. Follow these instructions at home: Lifestyle  Do not use any products that contain nicotine or tobacco, such as cigarettes, e-cigarettes, and chewing tobacco. If you need help quitting, ask your health care provider.  Do not use street drugs.  Do not share needles.  Ask your health care provider for help if you need support or information about quitting drugs. Alcohol use  Do not drink alcohol if: ? Your health care provider tells you not to drink. ? You are pregnant, may be pregnant, or are planning to become pregnant.  If you drink alcohol: ? Limit how much you use to 0-1 drink a day. ? Limit intake if you are breastfeeding.  Be aware of how much alcohol is in your drink. In the [place] one drink equals one 12 oz bottle of beer (355 mL), one 5 oz glass of wine (148 mL), or one 1 oz glass of hard liquor (44 mL). General instructions  Schedule regular health, dental, and eye exams.  Stay current with your vaccines.  Tell your health care provider if: ? You often feel depressed. ? You have ever been abused or do not feel safe at home. Summary  Adopting a healthy lifestyle and getting preventive care are important in promoting health and wellness.  Follow your health care provider's instructions about healthy  diet, exercising, and getting tested or screened for diseases.  Follow your health care provider's instructions on monitoring your cholesterol and blood pressure. This information is not intended to replace advice given to you by your health care provider. Make sure you discuss any questions you have with your health care provider. Document Revised: 06/14/2018 Document Reviewed: 06/14/2018 Elsevier Patient Education  2020 [employer]  

## 2020-04-01 NOTE — Telephone Encounter (Signed)
The pt was notified that her urine had trace blood in it.  The pt said her last menstrual cycle was August 16th and the last time she had sexual intercourse was last month.  The pt said she agrees to having a renal ultrasound if she needs to.

## 2020-04-02 LAB — LIPID PANEL
Chol/HDL Ratio: 2.2 ratio (ref 0.0–4.4)
Cholesterol, Total: 177 mg/dL (ref 100–199)
HDL: 81 mg/dL (ref >39)
LDL Chol Calc (NIH): 82 mg/dL (ref 0–99)
Triglycerides: 74 mg/dL (ref 0–149)
VLDL Cholesterol Cal: 14 mg/dL (ref 5–40)

## 2020-04-02 LAB — CMP14+EGFR
ALT: 12 IU/L (ref 0–32)
AST: 17 IU/L (ref 0–40)
Albumin/Globulin Ratio: 1.5 (ref 1.2–2.2)
Albumin: 4.4 g/dL (ref 3.8–4.8)
Alkaline Phosphatase: 59 IU/L (ref 44–121)
BUN/Creatinine Ratio: 13 (ref 9–23)
BUN: 12 mg/dL (ref 6–24)
Bilirubin Total: 0.4 mg/dL (ref 0.0–1.2)
CO2: 24 mmol/L (ref 20–29)
Calcium: 9.2 mg/dL (ref 8.7–10.2)
Chloride: 99 mmol/L (ref 96–106)
Creatinine, Ser: 0.96 mg/dL (ref 0.57–1.00)
GFR calc Af Amer: 83 mL/min/{1.73_m2} (ref >59)
GFR calc non Af Amer: 72 mL/min/{1.73_m2} (ref >59)
Globulin, Total: 3 g/dL (ref 1.5–4.5)
Glucose: 80 mg/dL (ref 65–99)
Potassium: 3.8 mmol/L (ref 3.5–5.2)
Sodium: 136 mmol/L (ref 134–144)
Total Protein: 7.4 g/dL (ref 6.0–8.5)

## 2020-04-02 LAB — CBC
Hematocrit: 41.5 % (ref 34.0–46.6)
Hemoglobin: 13 g/dL (ref 11.1–15.9)
MCH: 25 pg — ABNORMAL LOW (ref 26.6–33.0)
MCHC: 31.3 g/dL — ABNORMAL LOW (ref 31.5–35.7)
MCV: 80 fL (ref 79–97)
Platelets: 325 10*3/uL (ref 150–450)
RBC: 5.19 x10E6/uL (ref 3.77–5.28)
RDW: 13.6 % (ref 11.7–15.4)
WBC: 7.4 10*3/uL (ref 3.4–10.8)

## 2020-04-02 LAB — HEPATITIS C ANTIBODY: Hep C Virus Ab: <0.1 s/co ratio (ref 0.0–0.9)

## 2020-04-02 LAB — VITAMIN D 25 HYDROXY (VIT D DEFICIENCY, FRACTURES): Vit D, 25-Hydroxy: 55.7 ng/mL (ref 30.0–100.0)

## 2020-04-08 ENCOUNTER — Other Ambulatory Visit: Payer: Self-pay | Admitting: Internal Medicine

## 2020-04-08 ENCOUNTER — Telehealth: Payer: Self-pay

## 2020-04-08 NOTE — Telephone Encounter (Signed)
The pt said that they are trying to charge her over $1000 to do a renal ultrasound and that she would like to speak to Dr. Allyne Gee about what exactly she is looking for.  The pt said she has no problem with the paying the copays if she has to but that she thinks the cost is steep and that she thinks it's because of the way they are trying to bill her.

## 2020-04-08 NOTE — Telephone Encounter (Signed)
Let's repeat her urine in one week, nurse visit. I scheduled this due to blood in urine. It may have been false positive. We can repeat test first. No problem. She must have high deductible.

## 2020-04-09 ENCOUNTER — Other Ambulatory Visit: Payer: Self-pay

## 2020-04-09 ENCOUNTER — Telehealth: Payer: Self-pay

## 2020-04-09 NOTE — Telephone Encounter (Signed)
The pt was notified that Dr. Allyne Gee said the pt can have her urine rechecked in 1 week to make sure it's not a false positive for blood in the pt's urine and the pt was scheduled an appt for a nurse visit.

## 2020-04-10 ENCOUNTER — Telehealth: Payer: Self-pay

## 2020-04-10 NOTE — Telephone Encounter (Signed)
The pt called to get the number to [facility] weight loss center.  The number [phone] was given to the patient.

## 2020-04-15 ENCOUNTER — Other Ambulatory Visit: Payer: Self-pay

## 2020-04-15 ENCOUNTER — Ambulatory Visit (INDEPENDENT_AMBULATORY_CARE_PROVIDER_SITE_OTHER): Payer: BC Managed Care – PPO

## 2020-04-15 VITALS — BP 132/80 | HR 80 | Temp 98.7°F | Ht 62.6 in | Wt 192.0 lb

## 2020-04-15 DIAGNOSIS — R3129 Other microscopic hematuria: Secondary | ICD-10-CM | POA: Diagnosis not present

## 2020-04-15 LAB — POCT URINALYSIS DIPSTICK
Bilirubin, UA: NEGATIVE
Glucose, UA: NEGATIVE
Ketones, UA: NEGATIVE
Leukocytes, UA: NEGATIVE
Nitrite, UA: NEGATIVE
Protein, UA: NEGATIVE
Spec Grav, UA: 1.025 (ref 1.010–1.025)
Urobilinogen, UA: 0.2 E.U./dL
pH, UA: 6 (ref 5.0–8.0)

## 2020-04-15 NOTE — Progress Notes (Addendum)
Here for a repeat urine due to blood in urine last visit. Pt has refused renal ultrasound.

## 2020-05-26 ENCOUNTER — Ambulatory Visit: Payer: Self-pay | Attending: Family

## 2020-05-26 DIAGNOSIS — Z23 Encounter for immunization: Secondary | ICD-10-CM

## 2020-08-04 ENCOUNTER — Ambulatory Visit: Payer: BC Managed Care – PPO | Admitting: Internal Medicine

## 2020-08-19 ENCOUNTER — Other Ambulatory Visit: Payer: Self-pay

## 2020-08-19 ENCOUNTER — Ambulatory Visit (INDEPENDENT_AMBULATORY_CARE_PROVIDER_SITE_OTHER): Payer: BC Managed Care – PPO | Admitting: Internal Medicine

## 2020-08-19 ENCOUNTER — Encounter: Payer: Self-pay | Admitting: Internal Medicine

## 2020-08-19 VITALS — BP 122/84 | HR 62 | Temp 98.0°F | Ht 62.6 in | Wt 195.8 lb

## 2020-08-19 DIAGNOSIS — R3129 Other microscopic hematuria: Secondary | ICD-10-CM

## 2020-08-19 DIAGNOSIS — Z7184 Encounter for health counseling related to travel: Secondary | ICD-10-CM

## 2020-08-19 DIAGNOSIS — I10 Essential (primary) hypertension: Secondary | ICD-10-CM

## 2020-08-19 DIAGNOSIS — Z6835 Body mass index (BMI) 35.0-35.9, adult: Secondary | ICD-10-CM

## 2020-08-19 NOTE — Progress Notes (Signed)
I,Katawbba Wiggins,acting as a Education administrator for Maximino Greenland, MD.,have documented all relevant documentation on the behalf of Maximino Greenland, MD,as directed by  Maximino Greenland, MD while in the presence of Maximino Greenland, MD.  This visit occurred during the SARS-CoV-2 public health emergency.  Safety protocols were in place, including screening questions prior to the visit, additional usage of staff PPE, and extensive cleaning of exam room while observing appropriate contact time as indicated for disinfecting solutions.  Subjective:     Patient ID: Regina Jennings , female    DOB: 1976/05/21 , 45 y.o.   MRN: [id number]   Chief Complaint  Patient presents with  . Hypertension  . Hematuria    HPI  She presents for BP check today. She reports compliance with meds. Also wants to f/u with hematuria. She went to [facility] for further evaluation. She was advised her u/a results were not concerning.   She is planning a trip to [place] and wants to know which immunizations are needed.   Hypertension This is a chronic problem. The current episode started more than 1 year ago. The problem has been gradually improving since onset. The problem is controlled. Pertinent negatives include no blurred vision, chest pain, palpitations or shortness of breath. Past treatments include angiotensin blockers.     Past Medical History:  Diagnosis Date  . 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 Hypertension   . Vitamin D deficiency      Family History  Problem Relation Age of Onset  . Stroke Maternal Grandmother   . Migraines Mother   . Ulcers Mother   . Hypertension Mother   . Hyperlipidemia Mother   . Irritable bowel syndrome Mother   . GER disease Mother      Current Outpatient Medications:  .  Cholecalciferol (VITAMIN D) 50 MCG (2000 UT) CAPS, Take 2,000 Units by mouth See admin instructions. Takes 2,000 units every day, Disp: , Rfl:  .  levonorgestrel-ethinyl estradiol  (SEASONALE) 0.15-0.03 MG tablet, Setlakin 0.15 mg-30 mcg (91) tablets,3 month dose pack, Disp: , Rfl:  .  Multiple Vitamins-Minerals (WOMENS DAILY FORMULA PO), Take by mouth. 1 daily, Disp: , Rfl:  .  ketoconazole (NIZORAL) 2 % shampoo, Apply to scalp daily prn (Patient not taking: Reported on 08/19/2020), Disp: 120 mL, Rfl: 3 .  losartan-hydrochlorothiazide (HYZAAR) 50-12.5 MG tablet, Take 1 tablet by mouth daily. (Patient not taking: Reported on 08/19/2020), Disp: 90 tablet, Rfl: 2   Allergies  Allergen Reactions  . Boric Acid Hives  . Flagyl [Metronidazole]   . Sulfa Antibiotics Rash  . Xyzal [Cetirizine Hcl] Rash     Review of Systems  Constitutional: Negative.   Eyes: Negative for blurred vision.  Respiratory: Negative.  Negative for shortness of breath.   Cardiovascular: Negative.  Negative for chest pain and palpitations.  Gastrointestinal: Negative.   Psychiatric/Behavioral: Negative.   All other systems reviewed and are negative.    Today's Vitals   08/19/20 0848  BP: 122/84  Pulse: 62  Temp: 98 F (36.7 C)  TempSrc: Oral  Weight: 195 lb 12.8 oz (88.8 kg)  Height: 5' 2.6" (1.59 m)   Body mass index is 35.13 kg/m.  Wt Readings from Last 3 Encounters:  08/26/20 199 lb 9.6 oz (90.5 kg)  08/19/20 195 lb 12.8 oz (88.8 kg)  04/15/20 192 lb (87.1 kg)   Objective:  Physical Exam Vitals and nursing note reviewed.  Constitutional:  Appearance: Normal appearance. She is obese.  HENT:     Head: Normocephalic and atraumatic.     Nose:     Comments: Masked     Mouth/Throat:     Comments: Masked  Cardiovascular:     Rate and Rhythm: Normal rate and regular rhythm.     Heart sounds: Normal heart sounds.  Pulmonary:     Breath sounds: Normal breath sounds.  Musculoskeletal:     Cervical back: Normal range of motion.  Skin:    General: Skin is warm.  Neurological:     General: No focal deficit present.     Mental Status: She is alert and oriented to person,  place, and time.         Assessment And Plan:     1. Essential hypertension, benign Comments: Chronic, well controlled.  She is advised to c/w low sodium diet. I will check renal function today.  - CMP14+EGFR  2. Microscopic hematuria Comments: Persistent, she has had trace blood. I will check urinalysis today. - Urinalysis, Complete (81001)  3. Class 2 severe obesity due to excess calories with serious comorbidity and body mass index (BMI) of 35.0 to 35.9 in adult [facility]) Comments: She is encouraged to aim for BMI less than 30 to decrease cardiac risk. Encouraged to aim for at least 150 minutes of exercise per week.   4. Travel advice encounter Comments: She is advised to contact Health Dept and/or Occupational Health at [facility] for vaccine advice. I will check titers as below.  - Measles/Mumps/Rubella Immunity - Hepatitis B Surface Antibody  Patient was given opportunity to ask questions. Patient verbalized understanding of the plan and was able to repeat key elements of the plan. All questions were answered to their satisfaction.   I, Maximino Greenland, MD, have reviewed all documentation for this visit. The documentation on 08/19/20 for the exam, diagnosis, procedures, and orders are all accurate and complete.  THE PATIENT IS ENCOURAGED TO PRACTICE SOCIAL DISTANCING DUE TO THE COVID-19 PANDEMIC.

## 2020-08-19 NOTE — Patient Instructions (Signed)

## 2020-08-20 LAB — URINALYSIS, COMPLETE
Bilirubin, UA: NEGATIVE
Glucose, UA: NEGATIVE
Ketones, UA: NEGATIVE
Leukocytes,UA: NEGATIVE
Nitrite, UA: NEGATIVE
Protein,UA: NEGATIVE
RBC, UA: NEGATIVE
Specific Gravity, UA: 1.015 (ref 1.005–1.030)
Urobilinogen, Ur: 0.2 mg/dL (ref 0.2–1.0)
pH, UA: 6 (ref 5.0–7.5)

## 2020-08-20 LAB — CMP14+EGFR
ALT: 15 IU/L (ref 0–32)
AST: 20 IU/L (ref 0–40)
Albumin/Globulin Ratio: 1.4 (ref 1.2–2.2)
Albumin: 4.6 g/dL (ref 3.8–4.8)
Alkaline Phosphatase: 61 IU/L (ref 44–121)
BUN/Creatinine Ratio: 9 (ref 9–23)
BUN: 11 mg/dL (ref 6–24)
Bilirubin Total: 0.3 mg/dL (ref 0.0–1.2)
CO2: 23 mmol/L (ref 20–29)
Calcium: 9.7 mg/dL (ref 8.7–10.2)
Chloride: 101 mmol/L (ref 96–106)
Creatinine, Ser: 1.16 mg/dL — ABNORMAL HIGH (ref 0.57–1.00)
GFR calc Af Amer: 66 mL/min/{1.73_m2} (ref >59)
GFR calc non Af Amer: 57 mL/min/{1.73_m2} — ABNORMAL LOW (ref >59)
Globulin, Total: 3.2 g/dL (ref 1.5–4.5)
Glucose: 85 mg/dL (ref 65–99)
Potassium: 4.4 mmol/L (ref 3.5–5.2)
Sodium: 140 mmol/L (ref 134–144)
Total Protein: 7.8 g/dL (ref 6.0–8.5)

## 2020-08-20 LAB — MEASLES/MUMPS/RUBELLA IMMUNITY
MUMPS ABS, IGG: 24.9 AU/mL (ref >10.9)
RUBEOLA AB, IGG: >300 AU/mL (ref >16.4)
Rubella Antibodies, IGG: 25.1 index (ref >0.99)

## 2020-08-20 LAB — MICROSCOPIC EXAMINATION
Casts: NONE SEEN /lpf
RBC, Urine: NONE SEEN /hpf (ref 0–2)

## 2020-08-20 LAB — HEPATITIS B SURFACE ANTIBODY,QUALITATIVE: Hep B Surface Ab, Qual: NONREACTIVE

## 2020-08-26 ENCOUNTER — Other Ambulatory Visit: Payer: Self-pay

## 2020-08-26 ENCOUNTER — Ambulatory Visit (INDEPENDENT_AMBULATORY_CARE_PROVIDER_SITE_OTHER): Payer: BC Managed Care – PPO

## 2020-08-26 VITALS — BP 120/82 | HR 92 | Temp 97.8°F | Ht 62.0 in | Wt 199.6 lb

## 2020-08-26 DIAGNOSIS — Z23 Encounter for immunization: Secondary | ICD-10-CM | POA: Diagnosis not present

## 2020-08-26 NOTE — Progress Notes (Signed)
Pt is here for Tdap immunization.

## 2020-09-02 ENCOUNTER — Ambulatory Visit: Payer: BC Managed Care – PPO | Admitting: Internal Medicine

## 2020-09-09 NOTE — Progress Notes (Signed)
   Covid-19 Vaccination Clinic  Name:  Regina Jennings    MRN: [id number] DOB: [birth year]  09/09/2020  [name] was observed post Covid-19 immunization for 15 minutes without incident. She was provided with Vaccine Information Sheet and instruction to access the V-Safe system.   [name] was instructed to call 911 with any severe reactions post vaccine: Marland Kitchen Difficulty breathing  . Swelling of face and throat  . A fast heartbeat  . A bad rash all over body  . Dizziness and weakness   Immunizations Administered    Name Date Dose VIS Date Route   Moderna Covid-19 Booster Vaccine 05/26/2020  3:20 PM 0.25 mL 04/23/2020 Intramuscular   Manufacturer: Moderna   Lot: [id number]   NDC: 16384-665-99

## 2020-10-09 ENCOUNTER — Encounter: Payer: Self-pay | Admitting: Internal Medicine

## 2020-10-09 LAB — HM MAMMOGRAPHY

## 2020-10-13 ENCOUNTER — Telehealth: Payer: Self-pay

## 2020-10-13 NOTE — Telephone Encounter (Signed)
The pt called to see what titers she had and she was told mmr and hep b, the pt was told that dr sanders said the pt needed an mmr and hep b/c vaccinations. The pt said the health department doesn't do travel immunizations, hasn't done them since covid and that they haven't done yellow fever in 6 yrs.

## 2020-12-17 ENCOUNTER — Ambulatory Visit: Payer: BC Managed Care – PPO | Admitting: Internal Medicine

## 2020-12-31 ENCOUNTER — Ambulatory Visit: Payer: BC Managed Care – PPO | Admitting: Internal Medicine

## 2020-12-31 ENCOUNTER — Encounter: Payer: Self-pay | Admitting: Internal Medicine

## 2020-12-31 ENCOUNTER — Other Ambulatory Visit: Payer: Self-pay

## 2020-12-31 VITALS — BP 120/82 | HR 74 | Temp 98.3°F | Ht 62.0 in | Wt 192.4 lb

## 2020-12-31 DIAGNOSIS — M542 Cervicalgia: Secondary | ICD-10-CM | POA: Diagnosis not present

## 2020-12-31 DIAGNOSIS — I10 Essential (primary) hypertension: Secondary | ICD-10-CM

## 2020-12-31 DIAGNOSIS — Z1211 Encounter for screening for malignant neoplasm of colon: Secondary | ICD-10-CM | POA: Diagnosis not present

## 2020-12-31 DIAGNOSIS — Z6835 Body mass index (BMI) 35.0-35.9, adult: Secondary | ICD-10-CM

## 2020-12-31 MED ORDER — LOSARTAN POTASSIUM-HCTZ 50-12.5 MG PO TABS: 1.0000 | ORAL_TABLET | Freq: Every day | ORAL | 2 refills | Status: DC

## 2020-12-31 NOTE — Progress Notes (Signed)
I,Katawbba Wiggins,acting as a Education administrator for Maximino Greenland, MD.,have documented all relevant documentation on the behalf of Maximino Greenland, MD,as directed by  Maximino Greenland, MD while in the presence of Maximino Greenland, MD.  This visit occurred during the SARS-CoV-2 public health emergency.  Safety protocols were in place, including screening questions prior to the visit, additional usage of staff PPE, and extensive cleaning of exam room while observing appropriate contact time as indicated for disinfecting solutions.  Subjective:     Patient ID: Regina Jennings , female    DOB: [birth year] , 45 y.o.   MRN: [id number]   Chief Complaint  Patient presents with   Hypertension    HPI  She presents for BP check today.  She reports compliance with meds. She denies headaches, chest pain and shortness of breath. She is excited about her upcoming trip to [place] next month.  Hypertension This is a chronic problem. The current episode started more than 1 year ago. The problem has been gradually improving since onset. The problem is controlled. Associated symptoms include neck pain. Pertinent negatives include no blurred vision, chest pain, palpitations or shortness of breath. Past treatments include angiotensin blockers.    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 Takes 2,000 units every day, Disp: , Rfl:    levonorgestrel-ethinyl estradiol (SEASONALE) 0.15-0.03 MG tablet, Setlakin 0.15 mg-30 mcg (91) tablets,3 month dose pack, Disp: ,  Rfl:    Multiple Vitamins-Minerals (WOMENS DAILY FORMULA PO), Take by mouth. 1 daily, Disp: , Rfl:    ketoconazole (NIZORAL) 2 % shampoo, Apply to scalp daily prn (Patient not taking: No sig reported), Disp: 120 mL, Rfl: 3   losartan-hydrochlorothiazide (HYZAAR) 50-12.5 MG tablet, Take 1 tablet by mouth daily., Disp: 90 tablet, Rfl: 2   Allergies  Allergen Reactions   Boric Acid Hives   Flagyl [Metronidazole]    Sulfa Antibiotics Rash   Xyzal [Cetirizine Hcl] Rash     Review of Systems  Constitutional: Negative.   Eyes:  Negative for blurred vision.  Respiratory: Negative.  Negative for shortness of breath.   Cardiovascular: Negative.  Negative for chest pain and palpitations.  Gastrointestinal: Negative.   Musculoskeletal:  Positive for neck pain.       She c/o anterior neck pain. States it is tender when she touches the area. She initially felt the discomfort about two weeks ago when putting moisturizer on this area. Denies pain with swallowing. Denies recent fever/chills. No pain with movement. Denies fall/trauma.   Psychiatric/Behavioral: Negative.    All other systems reviewed and are negative.   Today's Vitals   12/31/20 1115  BP: 120/82  Pulse: 74  Temp: 98.3 F (36.8 C)  TempSrc: Oral  Weight: 192 lb 6.4 oz (87.3 kg)  Height: 5' 2"  (1.575 m)   Body mass index is 35.19 kg/m.  Wt Readings from Last 3 Encounters:  12/31/20 192 lb 6.4 oz (  87.3 kg)  08/26/20 199 lb 9.6 oz (90.5 kg)  08/19/20 195 lb 12.8 oz (88.8 kg)    BP Readings from Last 3 Encounters:  12/31/20 120/82  08/26/20 120/82  08/19/20 122/84    Objective:  Physical Exam Vitals and nursing note reviewed.  Constitutional:      Appearance: Normal appearance. She is obese.  HENT:     Head: Normocephalic and atraumatic.     Nose:     Comments: Masked     Mouth/Throat:     Comments: Masked  Eyes:     Extraocular Movements: Extraocular movements intact.  Neck:     Vascular: No carotid bruit.      Trachea: Trachea and phonation normal.     Comments: Tenderness lateral to right lobe of thyroid, medial side of SCM is tender. No overlying erythema Cardiovascular:     Rate and Rhythm: Normal rate and regular rhythm.     Heart sounds: Normal heart sounds.  Pulmonary:     Effort: Pulmonary effort is normal.     Breath sounds: Normal breath sounds.  Musculoskeletal:     Cervical back: Normal range of motion. Tenderness present. No rigidity.  Lymphadenopathy:     Cervical: No cervical adenopathy.  Skin:    General: Skin is warm.  Neurological:     General: No focal deficit present.     Mental Status: She is alert.  Psychiatric:        Mood and Affect: Mood normal.        Behavior: Behavior normal.        Assessment And Plan:     1. Essential hypertension, benign Comments: Chronic, well controlled. She is encouraged to limit her intake of sweetened beverages. Advised to follow low sodium diet. She will f/u in 4 months for CPE.  - BMP8+EGFR  2. Cervicalgia Comments: I think her sx are due to muscle strain. She will try topical muscle rub. She will let me know if her sx persist.  3. Class 2 severe obesity due to excess calories with serious comorbidity and body mass index (BMI) of 35.0 to 35.9 in adult [facility]) Comments: She is encouraged to strive for BMI less than 30 to decrease cardiac risk. Advised to aim for at least 150 minutes of exercise per week.    4. Colon cancer screening Comments: I will refer her to GI for CRC screening.  - Ambulatory referral to Gastroenterology   Patient was given opportunity to ask questions. Patient verbalized understanding of the plan and was able to repeat key elements of the plan. All questions were answered to their satisfaction.   I, Maximino Greenland, MD, have reviewed all documentation for this visit. The documentation on 12/31/20 for the exam, diagnosis, procedures, and orders are all accurate and complete.   IF YOU HAVE BEEN REFERRED TO A  SPECIALIST, IT MAY TAKE 1-2 WEEKS TO SCHEDULE/PROCESS THE REFERRAL. IF YOU HAVE NOT HEARD FROM US/SPECIALIST IN TWO WEEKS, PLEASE GIVE [name] A CALL AT [phone].   THE PATIENT IS ENCOURAGED TO PRACTICE SOCIAL DISTANCING DUE TO THE COVID-19 PANDEMIC.

## 2020-12-31 NOTE — Patient Instructions (Signed)
Cooking With Less Salt Cooking with less salt is one way to reduce the amount of sodium you get from food. Sodium is one of the elements that make up salt. It is found naturally in foods and is also added to certain foods. Depending on your condition and overall health, your health care provider or dietitian may recommend that you reduce your sodium intake. Most people should have less than 2,300 milligrams (mg) of sodium each day. If you have high blood pressure (hypertension), you may need to limit your sodium to 1,500 mg each day. Follow the tipsbelow to help reduce your sodium intake. What are tips for eating less sodium? Reading food labels  Check the food label before buying or using packaged ingredients. Always check the label for the serving size and sodium content. Look for products with no more than 140 mg of sodium in one serving. Check the % Daily Value column to see what percent of the daily recommended amount of sodium is provided in one serving of the product. Foods with 5% or less in this column are considered low in sodium. Foods with 20% or higher are considered high in sodium. Do not choose foods with salt as one of the first three ingredients on the ingredients list. If salt is one of the first three ingredients, it usually means the item is high in sodium.  Shopping Buy sodium-free or low-sodium products. Look for the following words on food labels: Low-sodium. Sodium-free. Reduced-sodium. No salt added. Unsalted. Always check the sodium content even if foods are labeled as low-sodium or no salt added. Buy fresh foods. Cooking Use herbs, seasonings without salt, and spices as substitutes for salt. Use sodium-free baking soda when baking. Grill, braise, or roast foods to add flavor with less salt. Avoid adding salt to pasta, rice, or hot cereals. Drain and rinse canned vegetables, beans, and meat before use. Avoid adding salt when cooking sweets and desserts. Cook with  low-sodium ingredients. What foods are high in sodium? Vegetables Regular canned vegetables (not low-sodium or reduced-sodium). Sauerkraut, pickled vegetables, and relishes. Olives. French fries. Onion rings. Regular canned tomato sauce and paste. Regular tomato and vegetable juice. Frozenvegetables in sauces. Grains Instant hot cereals. Bread stuffing, pancake, and biscuit mixes. Croutons. Seasoned rice or pasta mixes. Noodle soup cups. Boxed or frozen macaroni and cheese. Regular salted crackers. Self-rising flour. Rolls. Bagels. Flourtortillas and wraps. Meats and other proteins Meat or fish that is salted, canned, smoked, cured, spiced, or pickled. This includes bacon, ham, sausages, hot dogs, corned beef, chipped beef, meat loaves, salt pork, jerky, pickled herring, anchovies, regular canned tuna, andsardines. Salted nuts. Dairy Processed cheese and cheese spreads. Cheese curds. Blue cheese. Feta cheese.String cheese. Regular cottage cheese. Buttermilk. Canned milk. The items listed above may not be a complete list of foods high in sodium. Actual amounts of sodium may be different depending on processing. Contact a dietitian for more information. What foods are low in sodium? Fruits Fresh, frozen, or canned fruit with no sauce added. Fruit juice. Vegetables Fresh or frozen vegetables with no sauce added. "No salt added" canned vegetables. "No salt added" tomato sauce and paste. Low-sodium orreduced-sodium tomato and vegetable juice. Grains Noodles, pasta, quinoa, rice. Shredded or puffed wheat or puffed rice. Regular or quick oats (not instant). Low-sodium crackers. Low-sodium bread. Whole-grainbread and whole-grain pasta. Unsalted popcorn. Meats and other proteins Fresh or frozen whole meats, poultry (not injected with sodium), and fish with no sauce added. Unsalted nuts. Dried peas, beans, and   lentils without added salt. Unsalted canned beans. Eggs. Unsalted nut butters. Low-sodium canned  tunaor chicken. Dairy Milk. Soy milk. Yogurt. Low-sodium cheeses, such as Swiss, Monterey Jack, mozzarella, and ricotta. Sherbet or ice cream (keep to  cup per serving).Cream cheese. Fats and oils Unsalted butter or margarine. Other foods Homemade pudding. Sodium-free baking soda and baking powder. Herbs and spices.Low-sodium seasoning mixes. Beverages Coffee and tea. Carbonated beverages. The items listed above may not be a complete list of foods low in sodium. Actual amounts of sodium may be different depending on processing. Contact a dietitian for more information. What are some salt alternatives when cooking? The following are herbs, seasonings, and spices that can be used instead of salt to flavor your food. Herbs should be fresh or dried. Do not choose packaged mixes. Next to the name of the herb, spice, or seasoning aresome examples of foods you can pair it with. Herbs Bay leaves - Soups, meat and vegetable dishes, and spaghetti sauce. Basil - Italian dishes, soups, pasta, and fish dishes. Cilantro - Meat, poultry, and vegetable dishes. Chili powder - Marinades and Mexican dishes. Chives - Salad dressings and potato dishes. Cumin - Mexican dishes, couscous, and meat dishes. Dill - Fish dishes, sauces, and salads. Fennel - Meat and vegetable dishes, breads, and cookies. Garlic (do not use garlic salt) - Italian dishes, meat dishes, salad dressings, and sauces. Marjoram - Soups, potato dishes, and meat dishes. Oregano - Pizza and spaghetti sauce. Parsley - Salads, soups, pasta, and meat dishes. Rosemary - Italian dishes, salad dressings, soups, and red meats. Saffron - Fish dishes, pasta, and some poultry dishes. Sage - Stuffings and sauces. Tarragon - Fish and poultry dishes. Thyme - Stuffing, meat, and fish dishes. Seasonings Lemon juice - Fish dishes, poultry dishes, vegetables, and salads. Vinegar - Salad dressings, vegetables, and fish dishes. Spices Cinnamon - Sweet  dishes, such as cakes, cookies, and puddings. Cloves - Gingerbread, puddings, and marinades for meats. Curry - Vegetable dishes, fish and poultry dishes, and stir-fry dishes. Ginger - Vegetable dishes, fish dishes, and stir-fry dishes. Nutmeg - Pasta, vegetables, poultry, fish dishes, and custard. Summary Cooking with less salt is one way to reduce the amount of sodium that you get from food. Buy sodium-free or low-sodium products. Check the food label before using or buying packaged ingredients. Use herbs, seasonings without salt, and spices as substitutes for salt in foods. This information is not intended to replace advice given to you by your health care provider. Make sure you discuss any questions you have with your healthcare provider. Document Revised: 06/13/2019 Document Reviewed: 06/13/2019 Elsevier Patient Education  2022 Elsevier Inc.  

## 2021-01-01 LAB — BMP8+EGFR
BUN/Creatinine Ratio: 12 (ref 9–23)
BUN: 11 mg/dL (ref 6–24)
CO2: 24 mmol/L (ref 20–29)
Calcium: 9.3 mg/dL (ref 8.7–10.2)
Chloride: 98 mmol/L (ref 96–106)
Creatinine, Ser: 0.94 mg/dL (ref 0.57–1.00)
Glucose: 97 mg/dL (ref 65–99)
Potassium: 4.4 mmol/L (ref 3.5–5.2)
Sodium: 138 mmol/L (ref 134–144)
eGFR: 76 mL/min/{1.73_m2} (ref >59)

## 2021-01-08 ENCOUNTER — Telehealth: Payer: Self-pay

## 2021-01-08 ENCOUNTER — Ambulatory Visit: Payer: Self-pay | Attending: Family

## 2021-01-08 DIAGNOSIS — Z23 Encounter for immunization: Secondary | ICD-10-CM

## 2021-01-08 NOTE — Progress Notes (Signed)
   Covid-19 Vaccination Clinic  Name:  ANNLOUISE GERETY    MRN: [id number] DOB: 07-25-1975  01/08/2021  Ms. Hainer was observed post Covid-19 immunization for 15 minutes without incident. She was provided with Vaccine Information Sheet and instruction to access the V-Safe system.   Ms. Clewis was instructed to call 911 with any severe reactions post vaccine: Difficulty breathing  Swelling of face and throat  A fast heartbeat  A bad rash all over body  Dizziness and weakness   Immunizations Administered     Name Date Dose VIS Date Route   Moderna Covid-19 Booster Vaccine 01/08/2021  4:22 PM 0.25 mL 04/23/2020 Intramuscular   Manufacturer: Moderna   Lot: [id number]   NDC: 27253-664-40

## 2021-01-08 NOTE — Telephone Encounter (Signed)
The pt called and left a message wanting Dr Allyne Gee professional opinion about the pt taking phentermine and the pt was told that Dr  sanders said that it could cause the pt's bp to rise.  The pt wants to know it there is a similar once per day pill that she could take and it not cause her bp to rise, if so she would like to schedule an appointment.

## 2021-01-12 ENCOUNTER — Other Ambulatory Visit: Payer: Self-pay | Admitting: Internal Medicine

## 2021-01-12 DIAGNOSIS — E049 Nontoxic goiter, unspecified: Secondary | ICD-10-CM

## 2021-01-14 NOTE — Telephone Encounter (Signed)
She will need nurse visit for teaching. Will have to start with Ozempic and switch to [facility] due to supply issues.

## 2021-01-16 ENCOUNTER — Other Ambulatory Visit: Payer: Self-pay

## 2021-01-16 ENCOUNTER — Ambulatory Visit
Admission: RE | Admit: 2021-01-16 | Discharge: 2021-01-16 | Disposition: A | Discharge status: Home / Self Care | Payer: BC Managed Care – PPO | Source: Ambulatory Visit | Attending: Internal Medicine | Admitting: Internal Medicine

## 2021-01-16 DIAGNOSIS — E049 Nontoxic goiter, unspecified: Secondary | ICD-10-CM

## 2021-01-22 ENCOUNTER — Other Ambulatory Visit: Payer: Self-pay

## 2021-02-10 ENCOUNTER — Ambulatory Visit: Payer: BC Managed Care – PPO

## 2021-02-10 ENCOUNTER — Other Ambulatory Visit: Payer: Self-pay

## 2021-02-10 VITALS — BP 130/70 | HR 70 | Temp 97.7°F | Ht 62.8 in | Wt 187.2 lb

## 2021-02-10 DIAGNOSIS — E6609 Other obesity due to excess calories: Secondary | ICD-10-CM

## 2021-02-10 NOTE — Progress Notes (Signed)
Patient is here for ozempic teaching. Patient would like to wait and start the medication after labor day.

## 2021-03-24 ENCOUNTER — Encounter: Payer: Self-pay | Admitting: Internal Medicine

## 2021-03-24 ENCOUNTER — Other Ambulatory Visit: Payer: Self-pay

## 2021-03-24 ENCOUNTER — Ambulatory Visit: Payer: BC Managed Care – PPO | Admitting: Internal Medicine

## 2021-03-24 VITALS — BP 120/76 | HR 70 | Temp 98.6°F | Ht 62.8 in | Wt 189.6 lb

## 2021-03-24 DIAGNOSIS — N760 Acute vaginitis: Secondary | ICD-10-CM | POA: Diagnosis not present

## 2021-03-24 DIAGNOSIS — H9313 Tinnitus, bilateral: Secondary | ICD-10-CM

## 2021-03-24 DIAGNOSIS — L219 Seborrheic dermatitis, unspecified: Secondary | ICD-10-CM

## 2021-03-24 DIAGNOSIS — L309 Dermatitis, unspecified: Secondary | ICD-10-CM

## 2021-03-24 MED ORDER — FLUOCINOLONE ACETONIDE BODY 0.01 % EX OIL: TOPICAL_OIL | CUTANEOUS | 0 refills | Status: DC

## 2021-03-24 MED ORDER — FLUCONAZOLE 150 MG PO TABS: 150.0000 mg | ORAL_TABLET | Freq: Every day | ORAL | 0 refills | Status: DC

## 2021-03-24 MED ORDER — NYSTATIN 100000 UNIT/GM EX CREA: 1.0000 "application " | TOPICAL_CREAM | Freq: Two times a day (BID) | CUTANEOUS | 0 refills | Status: AC

## 2021-03-24 NOTE — Progress Notes (Signed)
I,Katawbba Wiggins,acting as a Neurosurgeon for Gwynneth Aliment, MD.,have documented all relevant documentation on the behalf of Gwynneth Aliment, MD,as directed by  Gwynneth Aliment, MD while in the presence of Gwynneth Aliment, MD.  This visit occurred during the SARS-CoV-2 public health emergency.  Safety protocols were in place, including screening questions prior to the visit, additional usage of staff PPE, and extensive cleaning of exam room while observing appropriate contact time as indicated for disinfecting solutions.  Subjective:     Patient ID: Regina Jennings , female    DOB: [birth year] , 45 y.o.   MRN: [id number]   Chief Complaint  Patient presents with   Tinnitus   nodules   Dermatitis    HPI  The patient is here for evaluation of ringing in ears, nodules behind both ears, and rash. She has been having persistent ringing of her ears - not sure what is triggering her sx. Denies hearing loss.  Has been evaluated by ENT in the past - at that time she had vertigo. She is frustrated b/c she never was advised of a cause for her vertigo. Additionally, she has some nodules behind her ears, not sure if this is related to the ringing sounds she hears. Usually occurs at night.   Lastly, she has rash on her body - not sure of trigger. Does not look like blisters.   Rash This is a new problem. The current episode started 1 to 4 weeks ago. The problem has been gradually worsening since onset. The affected locations include the scalp, face, left eye, left ear, right eye and right ear. The rash is characterized by scaling and itchiness. Pertinent negatives include no fatigue, fever, joint pain or rhinorrhea. Past treatments include anti-itch cream (tetri oil). The treatment provided no relief. There is no history of allergies, asthma or eczema.    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 Takes 2,000 units every day, Disp: , Rfl:    fluconazole (DIFLUCAN) 150 MG tablet, Take 1 tablet (150 mg total) by mouth daily., Disp: 2 tablet, Rfl: 0   Fluocinolone Acetonide Body (DERMA-SMOOTHE/FS BODY) 0.01 % OIL, Apply to scalp daily prn, Disp: 118 mL, Rfl: 0   levonorgestrel-ethinyl estradiol (SEASONALE) 0.15-0.03 MG tablet, Setlakin 0.15 mg-30 mcg (91) tablets,3 month dose pack, Disp: , Rfl:    losartan-hydrochlorothiazide (HYZAAR) 50-12.5 MG tablet, Take 1 tablet by mouth daily., Disp: 90 tablet, Rfl: 2   Lysine 1000 MG TABS, Take by mouth. 1 per day, Disp: , Rfl:    nystatin cream (MYCOSTATIN), Apply 1 application topically 2 (two) times daily., Disp: 30 g, Rfl: 0   OVER THE COUNTER MEDICATION, Instant immunity, immune support formula 2 per day, Disp: , Rfl:    ketoconazole (NIZORAL) 2 % shampoo, Apply to scalp daily prn (Patient not taking: No sig reported), Disp: 120 mL, Rfl: 3   Multiple Vitamins-Minerals (WOMENS DAILY FORMULA PO),  Take by mouth. 1 daily (Patient not taking: Reported on 03/24/2021), Disp: , Rfl:    Allergies  Allergen Reactions   Boric Acid Hives   Flagyl [Metronidazole]    Sulfa Antibiotics Rash   Xyzal [Cetirizine Hcl] Rash     Review of Systems  Constitutional: Negative.  Negative for fatigue and fever.  HENT:  Negative for rhinorrhea.   Respiratory: Negative.    Cardiovascular: Negative.   Gastrointestinal: Negative.   Genitourinary:  Positive for vaginal discharge.       Recently treated for BV by GYN, has persistent vaginal itching. Not sure if metronidazole has caused her to have yeast infection.    Musculoskeletal:  Negative for joint pain.  Skin:  Positive for rash.       She c/o scaly scalp and excessive scaling behind her ears   She is also concerned about rash on her extremities, scaly - not sure if its eczema. Not sure what could have triggered her sx. No change in diet.   Psychiatric/Behavioral: Negative.    All other systems reviewed and are negative.   Today's Vitals   03/24/21 1037  BP: 120/76  Pulse: 70  Temp: 98.6 F (37 C)  TempSrc: Oral  Weight: 189 lb 9.6 oz (86 kg)  Height: 5' 2.8" (1.595 m)   Body mass index is 33.8 kg/m.  Wt Readings from Last 3 Encounters:  03/24/21 189 lb 9.6 oz (86 kg)  02/10/21 187 lb 3.2 oz (84.9 kg)  12/31/20 192 lb 6.4 oz (87.3 kg)    BP Readings from Last 3 Encounters:  03/24/21 120/76  02/10/21 130/70  12/31/20 120/82    Objective:  Physical Exam Vitals and nursing note reviewed.  Constitutional:      Appearance: Normal appearance.  HENT:     Head: Normocephalic and atraumatic.     Nose:     Comments: Masked     Mouth/Throat:     Comments: Masked  Cardiovascular:     Rate and Rhythm: Normal rate and regular rhythm.     Heart sounds: Normal heart sounds.  Pulmonary:     Effort: Pulmonary effort is normal.     Breath sounds: Normal breath sounds.  Musculoskeletal:     Cervical back: Normal range of motion.  Lymphadenopathy:     Cervical: Cervical adenopathy present.     Right cervical: Superficial cervical adenopathy present.  Skin:    General: Skin is warm.     Comments: Scaly scalp at hairline. Excessive scales behind b/l ears, worse on the right - no vesicular lesions noted  Scaly patches on arms, no erythema noted  Neurological:     General: No focal deficit present.     Mental Status: She is alert.  Psychiatric:        Mood and Affect: Mood normal.        Behavior: Behavior normal.        Assessment And Plan:     1. Tinnitus of both ears Comments: Persistent. I will refer her to ENT for further  evaluation.  - Ambulatory referral to ENT  2. Seborrheic dermatitis of scalp Comments: I will send in rx Dermasmoothe to use once or twice weekly. I will also refer her to Derm for further eval. Also advised to remove dairy/gluten from her diet.  - Ambulatory referral to Dermatology  3. Dermatitis Comments: I will check food allergy panel. I will also refer her to Derm for further evaluation. - Liver Profile - CBC with  Diff - Allergens (22) Foods  4. Acute vaginitis Comments: She was recently treated for BV by GYN, w/ persistent d/c. I will send rx diflucan to local pharmacy.    Patient was given opportunity to ask questions. Patient verbalized understanding of the plan and was able to repeat key elements of the plan. All questions were answered to their satisfaction.   I, Gwynneth Aliment, MD, have reviewed all documentation for this visit. The documentation on 03/24/21 for the exam, diagnosis, procedures, and orders are all accurate and complete.   IF YOU HAVE BEEN REFERRED TO A SPECIALIST, IT MAY TAKE 1-2 WEEKS TO SCHEDULE/PROCESS THE REFERRAL. IF YOU HAVE NOT HEARD FROM US/SPECIALIST IN TWO WEEKS, PLEASE GIVE [name] A CALL AT [phone].   THE PATIENT IS ENCOURAGED TO PRACTICE SOCIAL DISTANCING DUE TO THE COVID-19 PANDEMIC.

## 2021-03-24 NOTE — Patient Instructions (Signed)
Seborrheic Dermatitis, Adult Seborrheic dermatitis is a skin disease that causes red, scaly patches. It usually occurs on the scalp, and it is often called dandruff. The patches may appear on other parts of the body. Skin patches tend to appear where there are many oil glands in the skin. Areas of the body that are commonly affected include the: Scalp. Ears. Eyebrows. Face. Bearded area of men's faces. Skin folds of the body, such as the armpits, groin, and buttocks. Chest. The condition may come and go for no known reason, and it is often long-lasting (chronic). What are the causes? The cause of this condition is not known. What increases the risk? The following factors may make you more likely to develop this condition: Having certain conditions, such as: HIV (human immunodeficiency virus). AIDS (acquired immunodeficiency syndrome). Parkinson's disease. Mood disorders, such as depression. Being 40-60 years old. What are the signs or symptoms? Symptoms of this condition include: Thick scales on the scalp. Redness on the face or in the armpits. Skin that is flaky. The flakes may be white or yellow. Skin that seems oily or dry but is not helped with moisturizers. Itching or burning in the affected areas. How is this diagnosed? This condition is diagnosed with a medical history and physical exam. A sample of your skin may be tested (skin biopsy). You may need to see a skin specialist (dermatologist). How is this treated? There is no cure for this condition, but treatment can help to manage the symptoms. You may get treatment to remove scales, lower the risk of skin infection, and reduce swelling or itching. Treatment may include: Creams that reduce skin yeast. Medicated shampoo. Moisturizing creams or ointments. Creams that reduce swelling and irritation (steroids). Follow these instructions at home: Apply over-the-counter and prescription medicines only as told by your health care  provider. Use any medicated shampoo, skin creams, or ointments only as told by your health care provider. Keep all follow-up visits as told by your health care provider. This is important. Contact a health care provider if: Your symptoms do not improve with treatment. Your symptoms get worse. You have new symptoms. Get help right away if: Your condition rapidly worsens with treatment. Summary Seborrheic dermatitis is a skin disease that causes red, scaly patches. Seborrheic dermatitis commonly affects the scalp, face, and skin folds. There is no cure for this condition, but treatment can help to manage the symptoms. This information is not intended to replace advice given to you by your health care provider. Make sure you discuss any questions you have with your health care provider. Document Revised: 03/29/2019 Document Reviewed: 03/29/2019 Elsevier Patient Education  2022 [employer] Inc.  

## 2021-03-26 LAB — ALLERGENS (22) FOODS IGG
Casein IgG: 2.9 ug/mL — ABNORMAL HIGH (ref 0.0–1.9)
Cheese, Mold Type IgG: 20.8 ug/mL — ABNORMAL HIGH (ref 0.0–1.9)
Chicken IgG: <2 ug/mL (ref 0.0–1.9)
Chili Pepper IgG: 7.8 ug/mL — ABNORMAL HIGH (ref 0.0–1.9)
Chocolate/Cacao IgG: 2.9 ug/mL — ABNORMAL HIGH (ref 0.0–1.9)
Coffee IgG: 6.1 ug/mL — ABNORMAL HIGH (ref 0.0–1.9)
Corn IgG: 5.7 ug/mL — ABNORMAL HIGH (ref 0.0–1.9)
Egg, Whole IgG: 18.8 ug/mL — ABNORMAL HIGH (ref 0.0–1.9)
Green Bean IgG: 3 ug/mL — ABNORMAL HIGH (ref 0.0–1.9)
Haddock IgG: <2 ug/mL (ref 0.0–1.9)
Lamb IgG: 2.1 ug/mL — ABNORMAL HIGH (ref 0.0–1.9)
Oat IgG: 5.7 ug/mL — ABNORMAL HIGH (ref 0.0–1.9)
Onion IgG: 3.4 ug/mL — ABNORMAL HIGH (ref 0.0–1.9)
Peanut IgG: <2 ug/mL (ref 0.0–1.9)
Pork IgG: 4.1 ug/mL — ABNORMAL HIGH (ref 0.0–1.9)
Potato, White, IgG: 2.2 ug/mL — ABNORMAL HIGH (ref 0.0–1.9)
Rye IgG: 4.2 ug/mL — ABNORMAL HIGH (ref 0.0–1.9)
Shrimp IgG: 3.6 ug/mL — ABNORMAL HIGH (ref 0.0–1.9)
Soybean IgG: 2.1 ug/mL — ABNORMAL HIGH (ref 0.0–1.9)
Tomato IgG: 2.8 ug/mL — ABNORMAL HIGH (ref 0.0–1.9)
Wheat IgG: 4.5 ug/mL — ABNORMAL HIGH (ref 0.0–1.9)
Yeast IgG: 2.2 ug/mL — ABNORMAL HIGH (ref 0.0–1.9)

## 2021-03-26 LAB — CBC WITH DIFFERENTIAL/PLATELET
Basophils Absolute: 0 10*3/uL (ref 0.0–0.2)
Basos: 1 %
EOS (ABSOLUTE): 0.1 10*3/uL (ref 0.0–0.4)
Eos: 2 %
Hematocrit: 39.7 % (ref 34.0–46.6)
Hemoglobin: 12.6 g/dL (ref 11.1–15.9)
Immature Grans (Abs): 0 10*3/uL (ref 0.0–0.1)
Immature Granulocytes: 0 %
Lymphocytes Absolute: 2.5 10*3/uL (ref 0.7–3.1)
Lymphs: 40 %
MCH: 24.8 pg — ABNORMAL LOW (ref 26.6–33.0)
MCHC: 31.7 g/dL (ref 31.5–35.7)
MCV: 78 fL — ABNORMAL LOW (ref 79–97)
Monocytes Absolute: 0.5 10*3/uL (ref 0.1–0.9)
Monocytes: 8 %
Neutrophils Absolute: 3.1 10*3/uL (ref 1.4–7.0)
Neutrophils: 49 %
Platelets: 330 10*3/uL (ref 150–450)
RBC: 5.09 x10E6/uL (ref 3.77–5.28)
RDW: 15.7 % — ABNORMAL HIGH (ref 11.7–15.4)
WBC: 6.3 10*3/uL (ref 3.4–10.8)

## 2021-04-06 ENCOUNTER — Telehealth: Payer: Self-pay | Admitting: Dermatology

## 2021-04-06 ENCOUNTER — Ambulatory Visit (INDEPENDENT_AMBULATORY_CARE_PROVIDER_SITE_OTHER): Payer: BC Managed Care – PPO | Admitting: Internal Medicine

## 2021-04-06 ENCOUNTER — Encounter: Payer: Self-pay | Admitting: Internal Medicine

## 2021-04-06 ENCOUNTER — Other Ambulatory Visit: Payer: Self-pay

## 2021-04-06 VITALS — BP 120/70 | HR 71 | Temp 99.0°F | Ht 63.4 in | Wt 189.4 lb

## 2021-04-06 DIAGNOSIS — Z91018 Allergy to other foods: Secondary | ICD-10-CM | POA: Diagnosis not present

## 2021-04-06 DIAGNOSIS — L304 Erythema intertrigo: Secondary | ICD-10-CM | POA: Diagnosis not present

## 2021-04-06 DIAGNOSIS — Z6833 Body mass index (BMI) 33.0-33.9, adult: Secondary | ICD-10-CM

## 2021-04-06 DIAGNOSIS — I10 Essential (primary) hypertension: Secondary | ICD-10-CM | POA: Diagnosis not present

## 2021-04-06 DIAGNOSIS — Z0000 Encounter for general adult medical examination without abnormal findings: Secondary | ICD-10-CM

## 2021-04-06 DIAGNOSIS — E6609 Other obesity due to excess calories: Secondary | ICD-10-CM

## 2021-04-06 DIAGNOSIS — N611 Abscess of the breast and nipple: Secondary | ICD-10-CM

## 2021-04-06 DIAGNOSIS — Z1211 Encounter for screening for malignant neoplasm of colon: Secondary | ICD-10-CM

## 2021-04-06 DIAGNOSIS — L309 Dermatitis, unspecified: Secondary | ICD-10-CM

## 2021-04-06 LAB — POCT UA - MICROALBUMIN
Albumin/Creatinine Ratio, Urine, POC: 30
Creatinine, POC: 200 mg/dL
Microalbumin Ur, POC: 10 mg/L

## 2021-04-06 LAB — CMP14+EGFR
ALT: 17 IU/L (ref 0–32)
AST: 16 IU/L (ref 0–40)
Albumin/Globulin Ratio: 1.5 (ref 1.2–2.2)
Albumin: 4.6 g/dL (ref 3.8–4.8)
Alkaline Phosphatase: 69 IU/L (ref 44–121)
BUN/Creatinine Ratio: 10 (ref 9–23)
BUN: 11 mg/dL (ref 6–24)
Bilirubin Total: 0.4 mg/dL (ref 0.0–1.2)
CO2: 22 mmol/L (ref 20–29)
Calcium: 9.5 mg/dL (ref 8.7–10.2)
Chloride: 100 mmol/L (ref 96–106)
Creatinine, Ser: 1.08 mg/dL — ABNORMAL HIGH (ref 0.57–1.00)
Globulin, Total: 3 g/dL (ref 1.5–4.5)
Glucose: 83 mg/dL (ref 70–99)
Potassium: 5 mmol/L (ref 3.5–5.2)
Sodium: 139 mmol/L (ref 134–144)
Total Protein: 7.6 g/dL (ref 6.0–8.5)
eGFR: 65 mL/min/{1.73_m2} (ref >59)

## 2021-04-06 LAB — POCT URINALYSIS DIPSTICK
Bilirubin, UA: NEGATIVE
Glucose, UA: NEGATIVE
Ketones, UA: NEGATIVE
Leukocytes, UA: NEGATIVE
Nitrite, UA: NEGATIVE
Protein, UA: NEGATIVE
Spec Grav, UA: 1.015 (ref 1.010–1.025)
Urobilinogen, UA: 0.2 E.U./dL
pH, UA: 6.5 (ref 5.0–8.0)

## 2021-04-06 LAB — HEMOGLOBIN A1C
Est. average glucose Bld gHb Est-mCnc: 120 mg/dL
Hgb A1c MFr Bld: 5.8 % — ABNORMAL HIGH (ref 4.8–5.6)

## 2021-04-06 LAB — LIPID PANEL
Chol/HDL Ratio: 3.3 ratio (ref 0.0–4.4)
Cholesterol, Total: 203 mg/dL — ABNORMAL HIGH (ref 100–199)
HDL: 61 mg/dL (ref >39)
LDL Chol Calc (NIH): 129 mg/dL — ABNORMAL HIGH (ref 0–99)
Triglycerides: 73 mg/dL (ref 0–149)
VLDL Cholesterol Cal: 13 mg/dL (ref 5–40)

## 2021-04-06 MED ORDER — CEFTRIAXONE SODIUM 500 MG IJ SOLR
500.0000 mg | Freq: Once | INTRAMUSCULAR | Status: AC
  Administered 2021-04-06: 500 mg via INTRAMUSCULAR

## 2021-04-06 MED ORDER — DOXYCYCLINE HYCLATE 100 MG PO CAPS: 100.0000 mg | ORAL_CAPSULE | Freq: Two times a day (BID) | ORAL | 0 refills | Status: DC

## 2021-04-06 NOTE — Telephone Encounter (Signed)
Patient is calling for a referral appointment from Dorothyann Peng, M.D., but does not want to wait until April 2023 for an appointment so would like referral sent back to Dr. Allyne Gee.

## 2021-04-06 NOTE — Telephone Encounter (Signed)
Per referral in Epic this has been sent elsewhere.

## 2021-04-06 NOTE — Progress Notes (Signed)
I,Tianna Badgett,acting as a Education administrator for Maximino Greenland, MD.,have documented all relevant documentation on the behalf of Maximino Greenland, MD,as directed by  Maximino Greenland, MD while in the presence of Maximino Greenland, MD.  This visit occurred during the SARS-CoV-2 public health emergency.  Safety protocols were in place, including screening questions prior to the visit, additional usage of staff PPE, and extensive cleaning of exam room while observing appropriate contact time as indicated for disinfecting solutions.  Subjective:     Patient ID: Regina Jennings , female    DOB: [birth year] , 45 y.o.   MRN: [id number]   Chief Complaint  Patient presents with   Annual Exam   Hypertension    HPI  She is here today for a full physical examination.  She is followed by Earnstine Regal, PA for her GYN exams.  She was seen for her pelvic exam earlier this year. She also receives her mammograms at [facility] as well.   Hypertension This is a chronic problem. The current episode started more than 1 year ago. The problem has been gradually improving since onset. The problem is controlled. Pertinent negatives include no blurred vision, palpitations or shortness of breath. Risk factors for coronary artery disease include sedentary lifestyle. Past treatments include angiotensin blockers and diuretics. The current treatment provides moderate improvement. Compliance problems include exercise.     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 Takes 2,000 units every day, Disp: , Rfl:    Fluocinolone Acetonide Body (DERMA-SMOOTHE/FS BODY) 0.01 % OIL, Apply to scalp daily prn, Disp: 118 mL, Rfl: 0   ketoconazole (NIZORAL) 2 % shampoo, Apply to scalp daily prn, Disp: 120 mL, Rfl: 3   levonorgestrel-ethinyl estradiol (SEASONALE) 0.15-0.03 MG tablet, Setlakin 0.15 mg-30 mcg (91) tablets,3 month dose pack, Disp: , Rfl:    losartan-hydrochlorothiazide (HYZAAR) 50-12.5 MG tablet, Take 1 tablet by mouth daily., Disp: 90 tablet, Rfl: 2   Lysine 1000 MG TABS, Take by mouth. 1 per day, Disp: , Rfl:    Multiple Vitamins-Minerals (WOMENS DAILY FORMULA PO), Take by mouth. 1 daily (Patient not taking: Reported on 03/24/2021), Disp: , Rfl:    nystatin cream (MYCOSTATIN), Apply 1 application topically 2 (two) times daily., Disp: 30 g, Rfl: 0   OVER THE COUNTER MEDICATION, Instant immunity, immune support formula 2 per day, Disp: , Rfl:    pimecrolimus (ELIDEL) 1 % cream, SMARTSIG:Sparingly Topical Twice Daily, Disp: , Rfl:    triamcinolone cream (KENALOG) 0.1 %, Apply topically 2 (two) times daily as needed., Disp: , Rfl:    Allergies  Allergen Reactions   Boric Acid Hives   Flagyl [Metronidazole]    Sulfa Antibiotics Rash   Xyzal [Cetirizine Hcl] Rash      The patient states she uses  OCP (estrogen/progesterone) for birth control. Last LMP was Patient's last menstrual period was 02/24/2021.. Negative for Dysmenorrhea. Negative for: breast discharge, breast lump(s), breast pain and breast self exam. Associated symptoms include abnormal vaginal bleeding. Pertinent negatives include abnormal bleeding (hematology), anxiety, decreased libido, depression, difficulty falling sleep, dyspareunia, history of infertility, nocturia, sexual dysfunction, sleep disturbances, urinary incontinence, urinary urgency, vaginal discharge and vaginal  itching. Diet regular.The patient states her exercise level is  intermittent.  . The patient's tobacco use is:  Social History   Tobacco Use  Smoking Status Never  Smokeless Tobacco Never  . She has been exposed to passive smoke. The patient's alcohol use is:  Social History   Substance and Sexual Activity  Alcohol Use Yes   Review of Systems  Constitutional: Negative.   HENT: Negative.    Eyes: Negative.  Negative for blurred vision.  Respiratory: Negative.  Negative for shortness of breath.   Cardiovascular: Negative.  Negative for palpitations.  Gastrointestinal: Negative.   Endocrine: Negative.   Genitourinary: Negative.   Musculoskeletal: Negative.   Skin:  Positive for rash.       She c/o boil unerneath her right breast  Allergic/Immunologic: Negative.   Neurological: Negative.   Hematological: Negative.   Psychiatric/Behavioral: Negative.      Today's Vitals   04/06/21 0931  BP: 120/70  Pulse: 71  Temp: 99 F (37.2 C)  TempSrc: Oral  Weight: 189 lb 6.4 oz (85.9 kg)  Height: 5' 3.4" (1.61 m)   Body mass index is 33.13 kg/m.  Wt Readings from Last 3 Encounters:  04/06/21 189 lb 6.4 oz (85.9 kg)  03/24/21 189 lb 9.6 oz (86 kg)  02/10/21 187 lb 3.2 oz (84.9 kg)    Objective:  Physical Exam Vitals and nursing note reviewed.  Constitutional:      Appearance: Normal appearance.  HENT:     Head: Normocephalic and atraumatic.     Right Ear: Tympanic membrane, ear canal and external ear normal.     Left Ear: Tympanic membrane, ear canal and external ear normal.     Nose: Nose normal.     Mouth/Throat:     Mouth: Mucous membranes are moist.     Pharynx: Oropharynx is clear.  Eyes:     Extraocular Movements: Extraocular movements intact.     Conjunctiva/sclera: Conjunctivae normal.     Pupils: Pupils are equal, round, and reactive to light.  Cardiovascular:     Rate and Rhythm: Normal rate and regular rhythm.     Pulses: Normal pulses.     Heart sounds:  Normal heart sounds.  Pulmonary:     Effort: Pulmonary effort is normal.     Breath sounds: Normal breath sounds. Stridor present.  Chest:  Breasts:    Tanner Score is 5.     Right: Normal.     Left: Normal.       Comments: Left breast normal.  Right breast with erythematous, warm, well-demarcated lesion located inferiorly. Tender to touch.   There is hyperpigmented, slightly hyperkeratotic areas underneath both breasts Abdominal:     General: Abdomen is flat. Bowel sounds are normal.     Palpations: Abdomen is soft.  Genitourinary:    Comments: deferred Musculoskeletal:        General: Normal range of motion.     Cervical back: Normal range of motion and neck supple.  Skin:    General: Skin is warm and dry.     Comments: Hyperpigmented, scaly rash on right hand  Neurological:     General: No focal deficit present.     Mental Status: She is alert and oriented to person, place, and time.  Psychiatric:        Mood and Affect: Mood normal.        Behavior: Behavior normal.        Assessment And Plan:     1. Routine general medical examination at health care facility Comments: A full exam was performed. Importance of monthly self breast exams was discussed with the patient. PATIENT IS ADVISED TO GET 30-45 MINUTES REGULAR EXERCISE NO LESS THAN FOUR TO FIVE DAYS PER WEEK - BOTH WEIGHTBEARING EXERCISES AND AEROBIC ARE RECOMMENDED.  PATIENT IS ADVISED TO FOLLOW A HEALTHY DIET WITH AT LEAST SIX FRUITS/VEGGIES PER DAY, DECREASE INTAKE OF RED MEAT, AND TO INCREASE FISH INTAKE TO TWO DAYS PER WEEK.  MEATS/FISH SHOULD NOT BE FRIED, BAKED OR BROILED IS PREFERABLE.  IT IS ALSO IMPORTANT TO CUT BACK ON YOUR SUGAR INTAKE. PLEASE AVOID ANYTHING WITH ADDED SUGAR, CORN SYRUP OR OTHER SWEETENERS. IF YOU MUST USE A SWEETENER, YOU CAN TRY STEVIA. IT IS ALSO IMPORTANT TO AVOID ARTIFICIALLY SWEETENERS AND DIET BEVERAGES. LASTLY, I SUGGEST WEARING SPF 50 SUNSCREEN ON EXPOSED PARTS AND ESPECIALLY WHEN IN  THE DIRECT SUNLIGHT FOR AN EXTENDED PERIOD OF TIME.  PLEASE AVOID FAST FOOD RESTAURANTS AND INCREASE YOUR WATER INTAKE.  - Hemoglobin A1c - CMP14+EGFR - Lipid panel - Ambulatory referral to Gastroenterology  2. Essential hypertension, benign Comments: Chronic, well controlled. EKG performed, NSR w/o acute changes. Advised to follow low sodium diet. NO med changes. F/U in 6 months.  - POCT Urinalysis Dipstick (81002) - POCT UA - Microalbumin - EKG 12-Lead  3. Multiple food allergies Comments: I will refer her to Allergy for further evaluation. She is in agreement with treatment plan.  - Ambulatory referral to Allergy  4. Abscess of right breast unrelated to pregnancy or breastfeeding Comments:  She was given Rocephin, 513m IM x 1. I will also send rx doxycycline to local pharmacy. Advised to take for full abx course.  - cefTRIAXone (ROCEPHIN) injection 500 mg  5. Intertrigo Comments: I will send rx Nystatin powder to apply underneath b/l breasts.  6. Eczema of both hands Comments: She has been seen by Derm, she will continue with current mgmt. She is encouraged to look into leaky gut as well.   7. Class 1 obesity due to excess calories with serious comorbidity and body mass index (BMI) of 33.0 to 33.9 in adult Comments: She is encouraged to strive for BMI less than 30 to decrease cardiac risk. Advised to aim for at least 150 minutes of exercise per week.   8. Special screening for malignant neoplasm of colon Comments: She agrees to GI referral for CRC screening.  - Ambulatory referral to Gastroenterology   Patient was given opportunity to ask questions. Patient verbalized understanding of the plan and was able to repeat key elements of the plan. All questions were answered to their satisfaction.   I, RMaximino Greenland MD, have reviewed all documentation for this visit. The documentation on 04/06/21 for the exam, diagnosis, procedures, and orders are all accurate and complete.    THE PATIENT IS ENCOURAGED TO PRACTICE SOCIAL DISTANCING DUE TO THE COVID-19 PANDEMIC.

## 2021-04-06 NOTE — Patient Instructions (Addendum)
Leaky gut/bone broth Gold bond powder  Health Maintenance, Female Adopting a healthy lifestyle and getting preventive care are important in promoting health and wellness. Ask your health care provider about: The right schedule for you to have regular tests and exams. Things you can do on your own to prevent diseases and keep yourself healthy. What should I know about diet, weight, and exercise? Eat a healthy diet  Eat a diet that includes plenty of vegetables, fruits, low-fat dairy products, and lean protein. Do not eat a lot of foods that are high in solid fats, added sugars, or sodium. Maintain a healthy weight Body mass index (BMI) is used to identify weight problems. It estimates body fat based on height and weight. Your health care provider can help determine your BMI and help you achieve or maintain a healthy weight. Get regular exercise Get regular exercise. This is one of the most important things you can do for your health. Most adults should: Exercise for at least 150 minutes each week. The exercise should increase your heart rate and make you sweat (moderate-intensity exercise). Do strengthening exercises at least twice a week. This is in addition to the moderate-intensity exercise. Spend less time sitting. Even light physical activity can be beneficial. Watch cholesterol and blood lipids Have your blood tested for lipids and cholesterol at 45 years of age, then have this test every 5 years. Have your cholesterol levels checked more often if: Your lipid or cholesterol levels are high. You are older than 45 years of age. You are at high risk for heart disease. What should I know about cancer screening? Depending on your health history and family history, you may need to have cancer screening at various ages. This may include screening for: Breast cancer. Cervical cancer. Colorectal cancer. Skin cancer. Lung cancer. What should I know about heart disease, diabetes, and high  blood pressure? Blood pressure and heart disease High blood pressure causes heart disease and increases the risk of stroke. This is more likely to develop in people who have high blood pressure readings, are of African descent, or are overweight. Have your blood pressure checked: Every 3-5 years if you are 89-19 years of age. Every year if you are 92 years old or older. Diabetes Have regular diabetes screenings. This checks your fasting blood sugar level. Have the screening done: Once every three years after age 27 if you are at a normal weight and have a low risk for diabetes. More often and at a younger age if you are overweight or have a high risk for diabetes. What should I know about preventing infection? Hepatitis B If you have a higher risk for hepatitis B, you should be screened for this virus. Talk with your health care provider to find out if you are at risk for hepatitis B infection. Hepatitis C Testing is recommended for: Everyone born from [birth year] through 1965. Anyone with known risk factors for hepatitis C. Sexually transmitted infections (STIs) Get screened for STIs, including gonorrhea and chlamydia, if: You are sexually active and are younger than 45 years of age. You are older than 45 years of age and your health care provider tells you that you are at risk for this type of infection. Your sexual activity has changed since you were last screened, and you are at increased risk for chlamydia or gonorrhea. Ask your health care provider if you are at risk. Ask your health care provider about whether you are at high risk for HIV. Your health  care provider may recommend a prescription medicine to help prevent HIV infection. If you choose to take medicine to prevent HIV, you should first get tested for HIV. You should then be tested every 3 months for as long as you are taking the medicine. Pregnancy If you are about to stop having your period (premenopausal) and you may become  pregnant, seek counseling before you get pregnant. Take 400 to 800 micrograms (mcg) of folic acid every day if you become pregnant. Ask for birth control (contraception) if you want to prevent pregnancy. Osteoporosis and menopause Osteoporosis is a disease in which the bones lose minerals and strength with aging. This can result in bone fractures. If you are 51 years old or older, or if you are at risk for osteoporosis and fractures, ask your health care provider if you should: Be screened for bone loss. Take a calcium or vitamin D supplement to lower your risk of fractures. Be given hormone replacement therapy (HRT) to treat symptoms of menopause. Follow these instructions at home: Lifestyle Do not use any products that contain nicotine or tobacco, such as cigarettes, e-cigarettes, and chewing tobacco. If you need help quitting, ask your health care provider. Do not use street drugs. Do not share needles. Ask your health care provider for help if you need support or information about quitting drugs. Alcohol use Do not drink alcohol if: Your health care provider tells you not to drink. You are pregnant, may be pregnant, or are planning to become pregnant. If you drink alcohol: Limit how much you use to 0-1 drink a day. Limit intake if you are breastfeeding. Be aware of how much alcohol is in your drink. In the [place] one drink equals one 12 oz bottle of beer (355 mL), one 5 oz glass of wine (148 mL), or one 1 oz glass of hard liquor (44 mL). General instructions Schedule regular health, dental, and eye exams. Stay current with your vaccines. Tell your health care provider if: You often feel depressed. You have ever been abused or do not feel safe at home. Summary Adopting a healthy lifestyle and getting preventive care are important in promoting health and wellness. Follow your health care provider's instructions about healthy diet, exercising, and getting tested or screened for  diseases. Follow your health care provider's instructions on monitoring your cholesterol and blood pressure. This information is not intended to replace advice given to you by your health care provider. Make sure you discuss any questions you have with your health care provider. Document Revised: 08/29/2020 Document Reviewed: 06/14/2018 Elsevier Patient Education  2022 [employer].

## 2021-04-16 LAB — HEPATIC FUNCTION PANEL
ALT: 11 IU/L (ref 0–32)
AST: 15 IU/L (ref 0–40)
Albumin: 4.3 g/dL (ref 3.8–4.8)
Alkaline Phosphatase: 55 IU/L (ref 44–121)
Bilirubin Total: <0.2 mg/dL (ref 0.0–1.2)
Bilirubin, Direct: <0.1 mg/dL (ref 0.00–0.40)
Total Protein: 7.1 g/dL (ref 6.0–8.5)

## 2021-04-16 LAB — SPECIMEN STATUS REPORT

## 2021-04-21 ENCOUNTER — Encounter: Payer: Self-pay | Admitting: Internal Medicine

## 2021-04-24 ENCOUNTER — Telehealth: Payer: Self-pay

## 2021-04-24 NOTE — Telephone Encounter (Signed)
The pt wants to know what Dr. Allyne Gee thinks about her getting a flu shot.  The pt said she has an egg allergy and was told to hold off on getting the vaccine but she's been getting and thinks she will be ok but she wants Dr. Allyne Gee opinion.

## 2021-06-11 ENCOUNTER — Ambulatory Visit: Payer: BC Managed Care – PPO | Admitting: Allergy

## 2021-06-24 ENCOUNTER — Ambulatory Visit: Payer: BC Managed Care – PPO | Attending: Family

## 2021-06-24 DIAGNOSIS — Z23 Encounter for immunization: Secondary | ICD-10-CM

## 2021-06-24 NOTE — Progress Notes (Signed)
° °  Covid-19 Vaccination Clinic  Name:  Regina Jennings    MRN: [id number] DOB: January 21, 1976  06/24/2021  Ms. Route was observed post Covid-19 immunization for 15 minutes without incident. She was provided with Vaccine Information Sheet and instruction to access the V-Safe system.   Ms. Neises was instructed to call 911 with any severe reactions post vaccine: Difficulty breathing  Swelling of face and throat  A fast heartbeat  A bad rash all over body  Dizziness and weakness   Immunizations Administered     Name Date Dose VIS Date Route   Moderna Covid-19 vaccine Bivalent Booster 06/24/2021  2:00 PM 0.5 mL 02/14/2021 Intramuscular   Manufacturer: Moderna   Lot: [id number]   NDC: 27062-376-28

## 2021-07-14 ENCOUNTER — Encounter: Payer: Self-pay | Admitting: Internal Medicine

## 2021-07-14 ENCOUNTER — Ambulatory Visit: Payer: BC Managed Care – PPO | Admitting: Internal Medicine

## 2021-07-14 ENCOUNTER — Other Ambulatory Visit: Payer: Self-pay

## 2021-07-14 VITALS — BP 132/80 | HR 75 | Temp 98.0°F | Ht 63.0 in | Wt 190.4 lb

## 2021-07-14 DIAGNOSIS — R7309 Other abnormal glucose: Secondary | ICD-10-CM | POA: Diagnosis not present

## 2021-07-14 DIAGNOSIS — E6609 Other obesity due to excess calories: Secondary | ICD-10-CM | POA: Diagnosis not present

## 2021-07-14 DIAGNOSIS — I10 Essential (primary) hypertension: Secondary | ICD-10-CM | POA: Diagnosis not present

## 2021-07-14 DIAGNOSIS — L219 Seborrheic dermatitis, unspecified: Secondary | ICD-10-CM

## 2021-07-14 DIAGNOSIS — Z6833 Body mass index (BMI) 33.0-33.9, adult: Secondary | ICD-10-CM

## 2021-07-14 MED ORDER — LOSARTAN POTASSIUM-HCTZ 50-12.5 MG PO TABS: 1.0000 | ORAL_TABLET | Freq: Every day | ORAL | 2 refills | Status: DC

## 2021-07-14 MED ORDER — FLUOCINOLONE ACETONIDE BODY 0.01 % EX OIL: TOPICAL_OIL | CUTANEOUS | 0 refills | Status: DC

## 2021-07-14 NOTE — Progress Notes (Signed)
I,Katawbba Wiggins,acting as a Neurosurgeon for Gwynneth Aliment, MD.,have documented all relevant documentation on the behalf of Gwynneth Aliment, MD,as directed by  Gwynneth Aliment, MD while in the presence of Gwynneth Aliment, MD.  This visit occurred during the SARS-CoV-2 public health emergency.  Safety protocols were in place, including screening questions prior to the visit, additional usage of staff PPE, and extensive cleaning of exam room while observing appropriate contact time as indicated for disinfecting solutions.  Subjective:     Patient ID: Regina Jennings , female    DOB: 06/02/1976 , 46 y.o.   MRN: [id number]   Chief Complaint  Patient presents with   Hypertension    HPI  She presents for BP check today. She reports compliance with meds. She denies headaches, chest pain and shortness of breath. Pt reports receiving her flu vaccine at work. She is not sure of the date. She states it was sometime in the last two weeks of December.   Hypertension This is a chronic problem. The current episode started more than 1 year ago. The problem has been gradually improving since onset. The problem is controlled. Pertinent negatives include no blurred vision, chest pain, palpitations or shortness of breath. Past treatments include angiotensin blockers.    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 Takes 2,000 units every day, Disp: , Rfl:    ketoconazole (NIZORAL) 2 % shampoo, Apply to scalp daily prn, Disp: 120 mL, Rfl: 3    levonorgestrel-ethinyl estradiol (SEASONALE) 0.15-0.03 MG tablet, Set[place]akin 0.15 mg-30 mcg (91) tablets,3 month dose pack, Disp: , Rfl:    nystatin cream (MYCOSTATIN), Apply 1 application topically 2 (two) times daily., Disp: 30 g, Rfl: 0   OVER THE COUNTER MEDICATION, Instant immunity, immune support formula 2 per day, Disp: , Rfl:    pimecrolimus (ELIDEL) 1 % cream, SMARTSIG:Sparingly Topical Twice Daily, Disp: , Rfl:    triamcinolone cream (KENALOG) 0.1 %, Apply topically 2 (two) times daily as needed., Disp: , Rfl:    Fluocinolone Acetonide Body (DERMA-SMOOTHE/FS BODY) 0.01 % OIL, Apply to scalp daily prn, Disp: 118 mL, Rfl: 0   losartan-hydrochlorothiazide (HYZAAR) 50-12.5 MG tablet, Take 1 tablet by mouth daily., Disp: 90 tablet, Rfl: 2   Allergies  Allergen Reactions   Boric Acid Hives   Flagyl [Metronidazole]    Sulfa Antibiotics Rash   Xyzal [Cetirizine Hcl] Rash     Review of Systems  Constitutional: Negative.   Eyes:  Negative for blurred vision.  Respiratory: Negative.  Negative for shortness of breath.   Cardiovascular: Negative.  Negative for chest pain and palpitations.  Gastrointestinal: Negative.   Neurological: Negative.   Psychiatric/Behavioral: Negative.      Today's Vitals   07/14/21 1524  BP: 132/80  Pulse: 75  Temp: 98 F (36.7 C)  Weight: 190 lb 6.4 oz (86.4 kg)  Height: 5\' 3"  (1.6 m)   Body mass index  is 33.73 kg/m.  Wt Readings from Last 3 Encounters:  07/14/21 190 lb 6.4 oz (86.4 kg)  04/06/21 189 lb 6.4 oz (85.9 kg)  03/24/21 189 lb 9.6 oz (86 kg)    Objective:  Physical Exam Vitals and nursing note reviewed.  Constitutional:      Appearance: Normal appearance.  HENT:     Head: Normocephalic and atraumatic.     Nose:     Comments: Masked     Mouth/Throat:     Comments: Masked Eyes:     Extraocular Movements: Extraocular movements intact.     Pupils: Pupils are equal, round, and reactive to light.  Cardiovascular:     Rate and Rhythm:  Normal rate and regular rhythm.     Heart sounds: Normal heart sounds.  Pulmonary:     Effort: Pulmonary effort is normal.     Breath sounds: Normal breath sounds.  Genitourinary:    Comments: deferred Musculoskeletal:     Cervical back: Normal range of motion.  Neurological:     General: No focal deficit present.     Mental Status: She is alert and oriented to person, place, and time.  Psychiatric:        Mood and Affect: Mood normal.        Behavior: Behavior normal.        Assessment And Plan:     1. Essential hypertension, benign Comments: Chronic, fair control. Goal BP <130/80 No med changes. She was congratulated on her dietary/lifestyle changes. She will f/u in May 2023.   2. Seborrheic dermatitis of scalp Comments: improved with use of DermaSmoothe. I will send refill as requested.  3. Other abnormal glucose Comments: Her a1c has been elevated in the past. I will recheck this at her next visit. She is encouraged to limit intake of sweetened beverages.   4. Class 1 obesity due to excess calories with serious comorbidity and body mass index (BMI) of 33.0 to 33.9 in adult Comments: She is encouraged to aim for at least 150 minutes of exercise per week. She plans to bike/skate more regularly to help her reach her exercise goal.   Patient was given opportunity to ask questions. Patient verbalized understanding of the plan and was able to repeat key elements of the plan. All questions were answered to their satisfaction.   I, Gwynneth Aliment, MD, have reviewed all documentation for this visit. The documentation on 07/14/21 for the exam, diagnosis, procedures, and orders are all accurate and complete.   IF YOU HAVE BEEN REFERRED TO A SPECIALIST, IT MAY TAKE 1-2 WEEKS TO SCHEDULE/PROCESS THE REFERRAL. IF YOU HAVE NOT HEARD FROM US/SPECIALIST IN TWO WEEKS, PLEASE GIVE [name] A CALL AT [phone] X [phone].   THE PATIENT IS ENCOURAGED TO PRACTICE SOCIAL DISTANCING DUE TO THE COVID-19  PANDEMIC.

## 2021-07-14 NOTE — Patient Instructions (Signed)

## 2021-07-23 ENCOUNTER — Encounter: Payer: Self-pay | Admitting: Allergy

## 2021-07-23 ENCOUNTER — Ambulatory Visit (INDEPENDENT_AMBULATORY_CARE_PROVIDER_SITE_OTHER): Payer: BC Managed Care – PPO | Admitting: Allergy

## 2021-07-23 ENCOUNTER — Other Ambulatory Visit: Payer: Self-pay

## 2021-07-23 VITALS — BP 130/80 | HR 88 | Temp 97.1°F | Resp 16 | Ht 63.5 in | Wt 188.2 lb

## 2021-07-23 DIAGNOSIS — L2089 Other atopic dermatitis: Secondary | ICD-10-CM | POA: Diagnosis not present

## 2021-07-23 DIAGNOSIS — J3089 Other allergic rhinitis: Secondary | ICD-10-CM | POA: Diagnosis not present

## 2021-07-23 DIAGNOSIS — L2489 Irritant contact dermatitis due to other agents: Secondary | ICD-10-CM

## 2021-07-23 NOTE — Progress Notes (Signed)
New Patient Note  RE: Regina Jennings MRN: [id number] DOB: [birth year] Date of Office Visit: 07/23/2021  Referring provider: Dorothyann Peng, MD Primary care provider: Dorothyann Peng, MD  Chief Complaint: eczema/allergies  History of present illness: Regina Jennings is a 46 y.o. female presenting today for consultation for eczema with abnormal food allergy test.    In September 2022 she states her hands broke out severely to the point that she had to work from home due to it.  She feels like it came out of no where.   She saw her PCP in regards to this and there was concern that maybe a food(s) was triggering it.  She states the rash on hands was very dry, cracking, bleeding around cuticles.  Hands would burn with contact of topical things.  Nothing topical was working at that time.   She is following with a dermatologist.  She does state the topical therapies she has now did help her hands as well as flares elsewhere.  She knows now to layer her medications now for better efficacy.  She states she was told it was eczema.  She has a history of eczema since childhood.  She had a patch on neck during childhood that would recur but it did go away after some time.  She states she went for years without any hive type of eczema flares until that resurfaced.  She states as an adult she is having eczema flares of back, breast, area.  She did not identify any particular things that have triggered her eczema until recently.  She has now been monitoring things she eats and uses more so.  She has noticed that certain deodorants and hair products are causing rash that wasn't doing so previously.  She dose have history of seb derm as well.    She states in regards to environmental allergies that she can have a bad year they can go several years without any symptoms.  When she does have a bad year she normally will use an antihistamine like Zyrtec and use a nasal spray like Flonase that do help with  nasal symptoms and generalized allergy symptoms.  She has no history of asthma or food allergy.  Of note she did have an allergy food panel done by her PCP that was positive to a lot of foods however this panel looked for IgG instead of the allergy antibody, IgE. She states all the foods on the panel she was eating prior without any issue and not noticing any increase in eczema symptoms.  She has not noted any foods that flare her eczema.  Review of systems: Review of Systems  Constitutional: Negative.   HENT: Negative.    Eyes: Negative.   Respiratory: Negative.    Cardiovascular: Negative.   Gastrointestinal: Negative.   Musculoskeletal: Negative.   Skin: Negative.   Allergic/Immunologic: Negative.   Neurological: Negative.    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  No pets in the home.  There is no concern for water damage, mildew or roaches in the home.  She has a Teacher, English as a foreign language.  She denies a smoking history.   Medication List: Current Outpatient Medications  Medication Sig Dispense Refill   Cholecalciferol (VITAMIN D) 50 MCG (2000 UT) CAPS Take 2,000 Units by mouth See admin instructions. Takes 2,000 units every day     clotrimazole-betamethasone (LOTRISONE) cream Apply 1 application topically as directed.     Fluocinolone  Acetonide Body (DERMA-SMOOTHE/FS BODY) 0.01 % OIL Apply to scalp daily prn 118 mL 0   ketoconazole (NIZORAL) 2 % shampoo Apply to scalp daily prn 120 mL 3   levonorgestrel-ethinyl estradiol (SEASONALE) 0.15-0.03 MG tablet Setlakin 0.15 mg-30 mcg (91) tablets,3 month dose pack     losartan-hydrochlorothiazide (HYZAAR) 50-12.5 MG tablet Take 1 tablet by mouth daily. 90 tablet 2   nystatin cream (MYCOSTATIN) Apply 1 application topically 2 (two) times daily. 30 g 0   OPZELURA 1.5 % CREA Apply 1 application topically 2 (two) times daily.     OVER THE COUNTER MEDICATION Instant immunity, immune support formula 2 per day     pimecrolimus (ELIDEL) 1 % cream SMARTSIG:Sparingly Topical Twice Daily     triamcinolone cream (KENALOG) 0.1 % Apply topically 2 (two) times daily as needed.     No current facility-administered medications for this visit.    Known medication allergies: Allergies  Allergen Reactions   Boric Acid Hives   Flagyl [Metronidazole]    Sulfa Antibiotics Rash   Xyzal [Cetirizine Hcl] Rash     Physical examination: Blood pressure 130/80, pulse 88, temperature (!) 97.1 F (36.2 C), resp. rate 16, height 5' 3.5" (1.613 m), weight 188 lb 3.2 oz (85.4 kg), SpO2 99 %.  General: Alert, interactive, in no acute distress. HEENT: PERRLA, TMs pearly gray, turbinates non-edematous without discharge, post-pharynx non erythematous. Neck: Supple without lymphadenopathy. Lungs: Clear to auscultation without wheezing, rhonchi or rales. {no increased work of breathing. CV: Normal S1, S2 without murmurs. Abdomen: Nondistended, nontender. Skin: Warm and dry, without lesions or rashes. Extremities:  No clubbing, cyanosis or edema. Neuro:   Grossly intact.  Diagnositics/Labs:  LABS:  Component     Latest Ref Rng & Units 03/24/2021  Wheat IgG     0.0 - 1.9 ug/mL 4.5 (H)  Rye IgG     0.0 - 1.9 ug/mL 4.2 (H)  Oat IgG     0.0 - 1.9 ug/mL 5.7 (H)  Corn IgG     0.0 - 1.9 ug/mL 5.7 (H)   Peanut IgG     0.0 - 1.9 ug/mL <2.0  Soybean IgG     0.0 - 1.9 ug/mL 2.1 (H)  Shrimp IgG     0.0 - 1.9 ug/mL 3.6 (H)  Tomato IgG     0.0 - 1.9 ug/mL 2.8 (H)  Pork IgG     0.0 - 1.9 ug/mL 4.1 (H)  Potato, White, IgG     0.0 - 1.9 ug/mL 2.2 (H)  Haddock IgG     0.0 - 1.9 ug/mL <2.0  Yeast IgG     0.0 - 1.9 ug/mL 2.2 (H)  Onion IgG     0.0 - 1.9 ug/mL 3.4 (H)  Casein IgG  0.0 - 1.9 ug/mL 2.9 (H)  Cheese, Mold Type IgG     0.0 - 1.9 ug/mL 20.8 (H)  Chicken IgG     0.0 - 1.9 ug/mL <2.0  Lamb IgG     0.0 - 1.9 ug/mL 2.1 (H)  Chocolate/Cacao IgG     0.0 - 1.9 ug/mL 2.9 (H)  Coffee IgG     0.0 - 1.9 ug/mL 6.1 (H)  Egg, Whole IgG     0.0 - 1.9 ug/mL 18.8 (H)  Chili Pepper IgG     0.0 - 1.9 ug/mL 7.8 (H)  Green Bean IgG     0.0 - 1.9 ug/mL 3.0 (H)    Allergy testing:   Airborne Adult Perc - 07/23/21 1440     Time Antigen Placed 1440    Allergen Manufacturer Greer    Location Back    Number of Test 59    Panel 1 Select    1. Control-Buffer 50% Glycerol Negative    2. Control-Histamine 1 mg/ml 2+    3. Albumin saline Negative    4. Bahia Negative    5. French Southern TerritoriesBermuda Negative    6. Johnson Negative    7. Kentucky Blue Negative    8. Meadow Fescue Negative    9. Perennial Rye Negative    10. Sweet Vernal Negative    11. Timothy Negative    12. Cocklebur Negative    13. Burweed Marshelder Negative    14. Ragweed, short Negative    15. Ragweed, Giant Negative    16. Plantain,  English Negative    17. Lamb's Quarters Negative    18. Sheep Sorrell Negative    19. Rough Pigweed Negative    20. Marsh Elder, Rough Negative    21. Mugwort, Common Negative    22. Ash mix Negative    23. Birch mix Negative    24. Beech American 2+    25. Box, Elder Negative    26. Cedar, red 2+    27. Cottonwood, Guinea-BissauEastern Negative    28. Elm mix Negative    29. Hickory 3+    30. Maple mix Negative    31. Oak, Guinea-Bissau[place]astern mix Negative    32. Pecan Pollen Negative    33. Pine mix  Negative    34. Sycamore Eastern Negative    35. Walnut, Black Pollen Negative    36. Alternaria alternata Negative    37. Cladosporium Herbarum Negative    38. Aspergillus mix Negative    39. Penicillium mix Negative    40. Bipolaris sorokiniana (Helminthosporium) Negative    41. Drechslera spicifera (Curvularia) Negative    42. Mucor plumbeus Negative    43. Fusarium moniliforme Negative    44. Aureobasidium pullulans (pullulara) Negative    45. Rhizopus oryzae Negative    46. Botrytis cinera Negative    47. Epicoccum nigrum Negative    48. Phoma betae Negative    49. Candida Albicans Negative    50. Trichophyton mentagrophytes Negative    51. Mite, D Farinae  5,000 AU/ml Negative    52. Mite, D Pteronyssinus  5,000 AU/ml Negative    53. Cat Hair 10,000 BAU/ml Negative    54.  Dog Epithelia 2+    55. Mixed Feathers Negative    56. Horse Epithelia Negative    57. Cockroach, German Negative    58. Mouse Negative    59. Tobacco Leaf Negative             Food Perc -  07/23/21 1607       Test Information   Time Antigen Placed 1440    Allergen Manufacturer Waynette ButteryGreer    Location Back    Number of allergen test 10    Food Select      Food   1. Peanut Negative    2. Soybean food Negative    3. Wheat, whole Negative    4. Sesame Negative    5. Milk, cow Negative    6. Egg White, chicken Negative    7. Casein Negative    8. Shellfish mix Negative    9. Fish mix Negative    10. Cashew Negative             Intradermal - 07/23/21 1516     Allergen Manufacturer Waynette ButteryGreer    Location Arm    Number of Test 11    Intradermal Select    Control Negative    7 Grass 3+    Ragweed mix Negative    Weed mix Negative    Mold 1 Negative    Mold 2 Negative    Mold 3 Negative    Mold 4 Negative    Cat 2+    Cockroach Negative    Mite mix 3+             Allergy testing results were read and interpreted by provider, documented by clinical staff.   Assessment and  plan: Atopic dermatitis Contact dermatitis Allergic rhinitis    -you are not allergic to foods.  You can continue your regular diet.   The food panel done was an IgG panel which is a "memory" antibody.  IgE is the allergy antibody and thus for food allergy we look to see if you have IgE.  Thus the food panel done by blood work tells me you have had exposure to these foods in the past.  -common food allergy skin testing today is negative  -environmental allergy testing is positive to grass pollen, tree pollen, cat, dog and dust mites.  Allergen avoidance measures provided. -For allergy symptom control can take long-acting antihistamines like Xyzal, Zyrtec or Allegra daily as needed -For itchy, watery eyes can use over-the-counter eyedrops like Pataday, Pazeo, Zaditor -For nasal congestion and drainage can use over-the-counter nasal sprays like Flonase, Rhinocort or Nasacort  -continue as needed use of your topical therapy as directed by dermatology -Moisturize daily after bathing -Monitor if any symptoms worsen your eczema  -patch testing is performed to assess for contact dermatitis.  Patch testing is best placed on a Monday with return to office on Wednesday and Friday of same week for readings.  Once patches are in place do not get them wet.  Advised to avoid oral or injectable steroids for 4 weeks prior to and topical steroids 2 weeks prior to patch testing.  You can take antihistamines for patch testing is needed.  You can schedule when convenient for you for patch testing.  You can also bring you your own topical items for patch testing (like deodorant or hair products)  follow-up in 4 to 6 months or sooner if needed.  Can schedule earlier for patch testing  I appreciate the opportunity to take part in Alisson's care. Please do not hesitate to contact me with questions.  Sincerely,   Margo AyeShaylar Nikesha Kwasny, MD Allergy/Immunology [facility]

## 2021-07-23 NOTE — Patient Instructions (Addendum)
°-  you are not allergic to foods.  You can continue your regular diet.   The food panel done was an IgG panel which is a "memory" antibody.  IgE is the allergy antibody and thus for food allergy we look to see if you have IgE.  Thus the food panel done by blood work tells me you have had exposure to these foods in the past.  -common food allergy testing today is negative  -environmental allergy testing is positive to grass pollen, tree pollen, cat, dog and dust mites.  Allergen avoidance measures provided. -For allergy symptom control can take long-acting antihistamines like Xyzal, Zyrtec or Allegra daily as needed -For itchy, watery eyes can use over-the-counter eyedrops like Pataday, Pazeo, Zaditor -For nasal congestion and drainage can use over-the-counter nasal sprays like Flonase, Rhinocort or Nasacort  -continue as needed use of your topical therapy as directed by dermatology -Moisturize daily after bathing -Monitor if any symptoms worsen your eczema  -patch testing is performed to assess for contact dermatitis.  Patch testing is best placed on a Monday with return to office on Wednesday and Friday of same week for readings.  Once patches are in place do not get them wet.  Advised to avoid oral or injectable steroids for 4 weeks prior to and topical steroids 2 weeks prior to patch testing.  You can take antihistamines for patch testing is needed.  You can schedule when convenient for you for patch testing (see below items in the TRUE test patch panels).  You can also bring you your own topical items for patch testing (like deodorant or hair products)  True Test looks for the following sensitivities:      Does follow-up in 4 to 6 months or sooner if needed.  Can schedule earlier for patch testing

## 2021-08-10 ENCOUNTER — Encounter: Payer: Self-pay | Admitting: Family

## 2021-08-10 ENCOUNTER — Other Ambulatory Visit: Payer: Self-pay

## 2021-08-10 ENCOUNTER — Ambulatory Visit: Payer: BC Managed Care – PPO | Admitting: Family

## 2021-08-12 ENCOUNTER — Encounter: Payer: BC Managed Care – PPO | Admitting: Allergy

## 2021-08-12 ENCOUNTER — Ambulatory Visit: Payer: BC Managed Care – PPO | Admitting: Allergy

## 2021-08-14 ENCOUNTER — Encounter: Payer: BC Managed Care – PPO | Admitting: Allergy

## 2021-09-08 LAB — HM MAMMOGRAPHY

## 2021-09-14 ENCOUNTER — Encounter: Payer: Self-pay | Admitting: Family

## 2021-09-14 ENCOUNTER — Ambulatory Visit (INDEPENDENT_AMBULATORY_CARE_PROVIDER_SITE_OTHER): Payer: BC Managed Care – PPO | Admitting: Family

## 2021-09-14 ENCOUNTER — Other Ambulatory Visit: Payer: Self-pay

## 2021-09-14 VITALS — BP 122/78 | HR 81 | Temp 97.2°F | Resp 16 | Ht 63.5 in | Wt 190.8 lb

## 2021-09-14 DIAGNOSIS — L2489 Irritant contact dermatitis due to other agents: Secondary | ICD-10-CM | POA: Diagnosis not present

## 2021-09-14 NOTE — Progress Notes (Signed)
Follow-up Note ? ?RE: Regina Jennings MRN: [id number] DOB: [birth year] ?Date of Office Visit: 09/14/2021 ? ?Primary care provider: Dorothyann Peng, MD ?Referring provider: Dorothyann Peng, MD ? ? ?Retia returns to the office today for the patch test placement, given suspected history of contact dermatitis.  ? ? ?Diagnostics: True Test patches placed along with age/fixer glued maximum hold pineapple,Candida Freedom Probiotic Lemon soap, T-Gel therapeutic shampoo by Neutrogena,Affinia moisture conditioner, head and shoulders shampoo plus conditioner, Bella curls coconut whipped cream leave in conditioner, Hemp conditioner, [name] extra creamy leave in conditioner, Bold Plex bond smoothing serum 6, acidified body wash byLume, Hairitage fixed on you edge control, Dove invisible dry moisturizing cream, done as recipe hair oil, and Degree sheer powder antiperspirant deodorant..  ? ? ?Plan:  ? ?Allergic contact dermatitis ?- Instructions provided on care of the patches for the next 48 hours. ?- [name] was instructed to avoid showering for the next 48 hours. ?- [name] will follow up in 48 hours and 96 hours for patch readings. ? ?Nehemiah Settle, FNP ?[facility] ?

## 2021-09-15 NOTE — Progress Notes (Signed)
? ?  Follow Up Note ? ?RE: Regina Jennings MRN: [id number] DOB: 1975/12/25 ?Date of Office Visit: 09/16/2021 ? ?Referring provider: Dorothyann Peng, MD ?Primary care provider: Dorothyann Peng, MD ? ?History of Present Illness: ?I had the pleasure of seeing Regina Jennings for a follow up visit at the [facility] on 09/16/2021. She is a 46 y.o. female, who is being followed for dermatitis. Today she is here for initial patch test interpretation, given suspected history of contact dermatitis.  ? ?Diagnostics:  ?TRUE TEST and select personal care products 48 hour reading:  ? T.R.U.E. Test - 09/16/21 1100   ? ? Time Antigen Placed 1110   ? Manufacturer Other   ? Lot # [id number]   ? Location Back   ? Number of Test 50   ? Reading Interval Day 1;Day 3;Day 5   ? Panel Panel 1;Panel 2;Panel 3   ? 1. Nickel Sulfate 1   ? 2. Wool Alcohols 0   ? 3. Neomycin Sulfate 0   ? 4. Potassium Dichromate 0   ? 5. Caine Mix 0   ? 6. Fragrance Mix 0   ? 7. Colophony 0   ? 8. Paraben Mix 0   ? 9. Negative Control 0   ? 10. Balsam of [place] 0   ? 11. Ethylenediamine Dihydrochloride 0   ? 12. Cobalt Dichloride 2   ? 13. p-tert Butylphenol Formaldehyde Resin 0   ? 14. Epoxy Resin 0   ? 15. Carba Mix 0   ? 16.  Black Rubber Mix 0   ? 17. Cl+ Me-Isothiazolinone 0   ? 18. Quaternium-15 0   ? 19. Methyldibromo Glutaronitrile 0   ? 20. p-Phenylenediamine 0   ? 21. Formaldehyde 0   ? 22. Mercapto Mix 0   ? 23. Thimerosal 0   ? 24. Thiuram Mix 0   ? 25. Diazolidinyl Urea 0   ? 26. Quinoline Mix 0   ? 27. Tixocortol-21-Pivalate 0   ? 28. Gold Sodium Thiosulfate 2   ? 29. Imidazolidinyl Urea 0   ? 30. Budesonide 0   ? 31. Hydrocortisone-17-Butyrate 0   ? 32. Mercaptobenzothiazole 0   ? 33. Bacitracin 0   ? 34. Parthenolide 0   ? 35. Disperse Blue 106 0   ? 36. 2-Bromo-2-Nitropropane-1,3-diol 0   ? Comments 2.(2+) 13.(3+) the rest are negative   ? ?  ?  ? ?  ? ?  ?Assessment and Plan: ?Regina Jennings is a 46 y.o. female with: ?Allergic contact  dermatitis ?TRUE Test and extra personal care patches removed and 48 hour reading showed: ?Positive to: ?Nickel sulfate 1+ ?Cobalt dichloride 2+ ?Gold sodium thiosulfate 2+ ?Positive to:  ?[name] 2+ ?Dove invisible dry white freesia and violet flower scent 3+  ? ?The patient has been provided detailed information regarding the substances she is sensitive to, as well as products containing the substances.  Meticulous avoidance of these substances is recommended.  ? ?Return in about 2 days (around 09/18/2021) for Patch reading. ? ?It was my pleasure to see Regina Jennings today and participate in her care. Please feel free to contact me with any questions or concerns. ? ?Sincerely, ? ?Wyline Mood, DO ?Allergy & Immunology ? ?[facility] ?[place] office: [phone] ?[place] office: [phone] ?[place] office: [phone] ?

## 2021-09-16 ENCOUNTER — Other Ambulatory Visit: Payer: Self-pay

## 2021-09-16 ENCOUNTER — Encounter: Payer: Self-pay | Admitting: Allergy

## 2021-09-16 ENCOUNTER — Ambulatory Visit: Payer: BC Managed Care – PPO | Admitting: Allergy

## 2021-09-16 DIAGNOSIS — L239 Allergic contact dermatitis, unspecified cause: Secondary | ICD-10-CM | POA: Insufficient documentation

## 2021-09-16 NOTE — Assessment & Plan Note (Addendum)
TRUE Test and extra personal care patches removed and 48 hour reading showed: ?? Positive to: ?? Nickel sulfate 1+ ?? Cobalt dichloride 2+ ?? Gold sodium thiosulfate 2+ ?? Positive to:  ?? [name] 2+ ?? Dove invisible dry white freesia and violet flower scent 3+  ?

## 2021-09-16 NOTE — Patient Instructions (Signed)
48 hour patch testing was positive to: ?Nickel sulfate, cobalt dichloride, gold sodium thiosulfate. ? ?[name] ?Dove invisible dry white freesia and violet flower scent.  ? ?Follow up in 2 days for final read. ? ?Handouts given. ?

## 2021-09-17 LAB — HM PAP SMEAR

## 2021-09-18 ENCOUNTER — Other Ambulatory Visit: Payer: Self-pay

## 2021-09-18 ENCOUNTER — Ambulatory Visit: Payer: BC Managed Care – PPO | Admitting: Allergy

## 2021-09-18 ENCOUNTER — Encounter: Payer: Self-pay | Admitting: Allergy

## 2021-09-18 DIAGNOSIS — L2489 Irritant contact dermatitis due to other agents: Secondary | ICD-10-CM

## 2021-09-18 NOTE — Progress Notes (Signed)
? ? ?Follow-up Note ? ?RE: Regina Jennings MRN: [id number] DOB: 04/15/1976 ?Date of Office Visit: 09/18/2021 ? ?Primary care provider: Dorothyann Peng, MD ?Referring provider: Dorothyann Peng, MD ? ? ?[name] returns to the office today for the final patch test interpretation, given suspected history of contact dermatitis.  ? ? ?Diagnostics:  ?TRUE TEST and personal products 96 hour reading:  ? T.R.U.E. Test   ? ? Row Name 09/18/21 1600  ?  ?  ?  ?  ? 1. Nickel Sulfate 0      ? 2. Wool Alcohols 0      ? 3. Neomycin Sulfate 0      ? 4. Potassium Dichromate 0      ? 5. Caine Mix 0      ? 6. Fragrance Mix 0      ? 7. Colophony 0      ? 8. Paraben Mix 0      ? 9. Negative Control 0      ? 10. Balsam of [place] 0      ? 11. Ethylenediamine Dihydrochloride 0      ? 12. Cobalt Dichloride 2      ? 13. p-tert Butylphenol Formaldehyde Resin 0      ? 14. Epoxy Resin 0      ? 15. Carba Mix 0      ? 16.  Black Rubber Mix 0      ? 17. Cl+ Me-Isothiazolinone 0      ? 18. Quaternium-15 0      ? 19. Methyldibromo Glutaronitrile 0      ? 20. p-Phenylenediamine 0      ? 21. Formaldehyde 0      ? 22. Mercapto Mix 0      ? 23. Thimerosal 0      ? 24. Thiuram Mix 0      ? 25. Diazolidinyl Urea 0      ? 26. Quinoline Mix 0      ? 27. Tixocortol-21-Pivalate 0      ? 28. Gold Sodium Thiosulfate 2      ? 29. Imidazolidinyl Urea 0      ? 30. Budesonide 0      ? 31. Hydrocortisone-17-Butyrate 0      ? 32. Mercaptobenzothiazole 0      ? 33. Bacitracin 0      ? 34. Parthenolide 0      ? 35. Disperse Blue 106 0      ? 36. 2-Bromo-2-Nitropropane-1,3-diol 0      ? Comments 0  Personal products all negative      ? ?  ?  ? ?  ? TRUE Test and personal products Day 1 reading from 09/16/20 ?1. Nickel Sulfate 1    ?  2. Wool Alcohols 0   ?  3. Neomycin Sulfate 0   ?  4. Potassium Dichromate 0   ?  5. Caine Mix 0   ?  6. Fragrance Mix 0   ?  7. Colophony 0   ?  8. Paraben Mix 0   ?  9. Negative Control 0   ?  10. Balsam of [place] 0   ?  11. Ethylenediamine  Dihydrochloride 0   ?  12. Cobalt Dichloride 2   ?  13. p-tert Butylphenol Formaldehyde Resin 0   ?  14. Epoxy Resin 0   ?  15. Carba Mix 0   ?  16.  Black Web designer  0   ?  17. Cl+ Me-Isothiazolinone 0   ?  18. Quaternium-15 0   ?  19. Methyldibromo Glutaronitrile 0   ?  20. p-Phenylenediamine 0   ?  21. Formaldehyde 0   ?  22. Mercapto Mix 0   ?  23. Thimerosal 0   ?  24. Thiuram Mix 0   ?  25. Diazolidinyl Urea 0   ?  26. Quinoline Mix 0   ?  27. Tixocortol-21-Pivalate 0   ?  28. Gold Sodium Thiosulfate 2   ?  29. Imidazolidinyl Urea 0   ?  30. Budesonide 0   ?  31. Hydrocortisone-17-Butyrate 0   ?  32. Mercaptobenzothiazole 0   ?  33. Bacitracin 0   ?  34. Parthenolide 0   ?  35. Disperse Blue 106 0   ?  36. 2-Bromo-2-Nitropropane-1,3-diol 0   ?  Comments 2.(2+) 13.(3+) the rest are negative   ? ? ? ? ?Plan:  ?Allergic contact dermatitis ? ?Positive to: ?Nickel sulfate 1+ ?Cobalt dichloride 2+ ?Gold sodium thiosulfate 2+ ?Positive to:  ?[name] 2+ ?Dove invisible dry white freesia and violet flower scent 3+  ? ?The patient has been provided detailed information regarding the substances she is sensitive to, as well as products containing the substances.  Meticulous avoidance of these substances is recommended. If avoidance is not possible, the use of barrier creams or lotions is recommended. ?Will send product safe list to email: [email or web address] ? ?Margo Aye, MD ?[facility] ?[facility] ? ? ?

## 2021-10-05 ENCOUNTER — Ambulatory Visit: Payer: BC Managed Care – PPO | Admitting: Internal Medicine

## 2021-10-05 ENCOUNTER — Encounter: Payer: Self-pay | Admitting: Internal Medicine

## 2021-10-05 VITALS — BP 120/80 | HR 65 | Temp 98.4°F | Ht 64.2 in | Wt 192.8 lb

## 2021-10-05 DIAGNOSIS — E6609 Other obesity due to excess calories: Secondary | ICD-10-CM

## 2021-10-05 DIAGNOSIS — R7309 Other abnormal glucose: Secondary | ICD-10-CM

## 2021-10-05 DIAGNOSIS — M778 Other enthesopathies, not elsewhere classified: Secondary | ICD-10-CM | POA: Diagnosis not present

## 2021-10-05 DIAGNOSIS — I10 Essential (primary) hypertension: Secondary | ICD-10-CM | POA: Diagnosis not present

## 2021-10-05 DIAGNOSIS — Z6832 Body mass index (BMI) 32.0-32.9, adult: Secondary | ICD-10-CM

## 2021-10-05 MED ORDER — DICLOFENAC SODIUM 1 % EX GEL: 4.0000 g | Freq: Four times a day (QID) | CUTANEOUS | 2 refills | Status: DC

## 2021-10-05 MED ORDER — LOSARTAN POTASSIUM-HCTZ 50-12.5 MG PO TABS: 1.0000 | ORAL_TABLET | Freq: Every day | ORAL | 2 refills | Status: DC

## 2021-10-05 NOTE — Progress Notes (Signed)
?Rich Brave Llittleton,acting as a Education administrator for Maximino Greenland, MD.,have documented all relevant documentation on the behalf of Maximino Greenland, MD,as directed by  Maximino Greenland, MD while in the presence of Maximino Greenland, MD.  ?This visit occurred during the SARS-CoV-2 public health emergency.  Safety protocols were in place, including screening questions prior to the visit, additional usage of staff PPE, and extensive cleaning of exam room while observing appropriate contact time as indicated for disinfecting solutions. ? ?Subjective:  ?  ? Patient ID: Regina Jennings , female    DOB: September 06, 1975 , 46 y.o.   MRN: [id number] ? ? ?Chief Complaint  ?Patient presents with  ? Arm Pain  ? ? ?HPI ? ?Patient presents today for left arm pain. She reports it has been going on for the past few months but it is more constant now. She is not sure what may have triggered her sx. It is described as a dull, throbbing pain. She has also noticed some tingling in her left hand. She denies LUE weakness. No relief with Tylenol. Pt has tried biofreeze and it helped with the pain a little bit.  ? ?Arm Pain  ?There was no injury mechanism. The pain is present in the left elbow and left forearm. The quality of the pain is described as stabbing (feels tight). The pain does not radiate. The pain is moderate. The pain has been Constant since the incident. Associated symptoms include numbness and tingling. Pertinent negatives include no muscle weakness. The symptoms are aggravated by lifting. She has tried nothing for the symptoms. The treatment provided no relief.   ? ?Past Medical History:  ?Diagnosis Date  ? Herpes simplex without mention of complication   ? HSV 2  ? Hypertension   ? Vitamin D deficiency   ?  ? ?Family History  ?Problem Relation Age of Onset  ? Stroke Maternal Grandmother   ? Migraines Mother   ? Ulcers Mother   ? Hypertension Mother   ? Hyperlipidemia Mother   ? Irritable bowel syndrome Mother   ? GER disease Mother    ? ? ? ?Current Outpatient Medications:  ?  diclofenac Sodium (VOLTAREN) 1 % GEL, Apply 4 g topically 4 (four) times daily., Disp: 150 g, Rfl: 2 ?  Cholecalciferol (VITAMIN D) 50 MCG (2000 UT) CAPS, Take 2,000 Units by mouth See admin instructions. Takes 2,000 units every day, Disp: , Rfl:  ?  clotrimazole-betamethasone (LOTRISONE) cream, Apply 1 application topically as directed., Disp: , Rfl:  ?  Fluocinolone Acetonide Body (DERMA-SMOOTHE/FS BODY) 0.01 % OIL, Apply to scalp daily prn, Disp: 118 mL, Rfl: 0 ?  ketoconazole (NIZORAL) 2 % shampoo, Apply to scalp daily prn, Disp: 120 mL, Rfl: 3 ?  levonorgestrel-ethinyl estradiol (SEASONALE) 0.15-0.03 MG tablet, [place]akin 0.15 mg-30 mcg (91) tablets,3 month dose pack, Disp: , Rfl:  ?  losartan-hydrochlorothiazide (HYZAAR) 50-12.5 MG tablet, Take 1 tablet by mouth daily., Disp: 90 tablet, Rfl: 2 ?  nystatin cream (MYCOSTATIN), Apply 1 application topically 2 (two) times daily., Disp: 30 g, Rfl: 0 ?  OPZELURA 1.5 % CREA, Apply 1 application topically 2 (two) times daily., Disp: , Rfl:  ?  OVER THE COUNTER MEDICATION, Instant immunity, immune support formula 2 per day, Disp: , Rfl:  ?  pimecrolimus (ELIDEL) 1 % cream, SMARTSIG:Sparingly Topical Twice Daily, Disp: , Rfl:  ?  triamcinolone cream (KENALOG) 0.1 %, Apply topically 2 (two) times daily as needed., Disp: , Rfl:   ? ?Allergies  ?  Allergen Reactions  ? Boric Acid Hives  ? Flagyl [Metronidazole]   ? Sulfa Antibiotics Rash  ? Xyzal [Cetirizine Hcl] Rash  ?  ? ?Review of Systems  ?Constitutional: Negative.   ?Respiratory: Negative.    ?Cardiovascular: Negative.   ?Gastrointestinal: Negative.   ?Neurological:  Positive for tingling and numbness.  ?Psychiatric/Behavioral: Negative.     ? ?Today's Vitals  ? 10/05/21 1025  ?BP: 120/80  ?Pulse: 65  ?Temp: 98.4 ?F (36.9 ?C)  ?Weight: 192 lb 12.8 oz (87.5 kg)  ?Height: 5' 4.2" (1.631 m)  ?PainSc: 3   ?PainLoc: Arm  ? ?Body mass index is 32.89 kg/m?.  ?Wt Readings from Last 3  Encounters:  ?10/05/21 192 lb 12.8 oz (87.5 kg)  ?09/14/21 190 lb 12.8 oz (86.5 kg)  ?08/10/21 191 lb 6.4 oz (86.8 kg)  ?  ? ?Objective:  ?Physical Exam ?Vitals and nursing note reviewed.  ?Constitutional:   ?   Appearance: Normal appearance.  ?HENT:  ?   Head: Normocephalic and atraumatic.  ?Eyes:  ?   Extraocular Movements: Extraocular movements intact.  ?Cardiovascular:  ?   Rate and Rhythm: Normal rate and regular rhythm.  ?   Heart sounds: Normal heart sounds.  ?Pulmonary:  ?   Effort: Pulmonary effort is normal.  ?   Breath sounds: Normal breath sounds.  ?Musculoskeletal:     ?   General: Tenderness present.  ?Skin: ?   General: Skin is warm.  ?Neurological:  ?   General: No focal deficit present.  ?   Mental Status: She is alert.  ?Psychiatric:     ?   Mood and Affect: Mood normal.     ?   Behavior: Behavior normal.  ?   ?Assessment And Plan:  ?   ?1. Left elbow tendinitis ?Comments: She is advised to apply Voltaren gel to affected L elbow BID and to wear an elbow sleeve. I will refer her to Ortho if her sx persist.  ? ?2. Essential hypertension, benign ?Comments: Chronic, well controlled. No med changes. She is encouraged to follow low sodium diet. She will f/u in 4 months. ? ?3. Other abnormal glucose ?Comments: Her a1c has been elevated in the past. I will recheck this today. She is encouraged to limit intake of sweetened beverages, including diet drinks. ?- BMP8+EGFR ?- Hemoglobin A1c ?- Insulin, random(561) ? ?4. Class 1 obesity due to excess calories with serious comorbidity and body mass index (BMI) of 32.0 to 32.9 in adult ?Comments: She is encouraged to aim for at least 150 minutes of exercise/week, while striving for BMI<28 to decrease cardiac risk.  ?  ?Patient was given opportunity to ask questions. Patient verbalized understanding of the plan and was able to repeat key elements of the plan. All questions were answered to their satisfaction.  ? ?I, Maximino Greenland, MD, have reviewed all  documentation for this visit. The documentation on 10/12/21 for the exam, diagnosis, procedures, and orders are all accurate and complete.  ? ?IF YOU HAVE BEEN REFERRED TO A SPECIALIST, IT MAY TAKE 1-2 WEEKS TO SCHEDULE/PROCESS THE REFERRAL. IF YOU HAVE NOT HEARD FROM US/SPECIALIST IN TWO WEEKS, PLEASE GIVE [name] A CALL AT [phone] X [phone].  ? ?THE PATIENT IS ENCOURAGED TO PRACTICE SOCIAL DISTANCING DUE TO THE COVID-19 PANDEMIC.   ?

## 2021-10-06 LAB — BMP8+EGFR
BUN/Creatinine Ratio: 12 (ref 9–23)
BUN: 12 mg/dL (ref 6–24)
CO2: 23 mmol/L (ref 20–29)
Calcium: 9.4 mg/dL (ref 8.7–10.2)
Chloride: 101 mmol/L (ref 96–106)
Creatinine, Ser: 1.03 mg/dL — ABNORMAL HIGH (ref 0.57–1.00)
Glucose: 80 mg/dL (ref 70–99)
Potassium: 4.2 mmol/L (ref 3.5–5.2)
Sodium: 137 mmol/L (ref 134–144)
eGFR: 68 mL/min/{1.73_m2} (ref >59)

## 2021-10-06 LAB — HEMOGLOBIN A1C
Est. average glucose Bld gHb Est-mCnc: 114 mg/dL
Hgb A1c MFr Bld: 5.6 % (ref 4.8–5.6)

## 2021-10-06 LAB — INSULIN, RANDOM: INSULIN: 10.9 u[IU]/mL (ref 2.6–24.9)

## 2021-10-16 LAB — HM COLONOSCOPY

## 2021-11-04 ENCOUNTER — Telehealth: Payer: Self-pay

## 2021-11-04 NOTE — Telephone Encounter (Signed)
Patient called stating she wanted to f/u with Dr.Sanders on her appt with [facility]. She wanted to thank her for reminding her that was a resource center for her. Pt stated she went to be seen for lightheadedness, and pressure in the front of her head that she could not shake. Pt reported her bp was 146/90 and she would like to schedule an appt with [name].   ? ?I returned her call to see how she was doing and to see if they started her on a new bp med. Pt stated she is feeling better and was not started on a new bp med. She also stated that she does not want to and is not open to starting a new bp med because her bp does not always run this high. After speaking with Dr.Sanders I advised her that she needs to cut back on her salt intake and keep her f/u appt with [name] on 11/17/21 so Dr.Sanders can reevaluate then. Pt understood and is ok with this. YL,RMA ?

## 2021-11-13 ENCOUNTER — Encounter: Payer: Self-pay | Admitting: Internal Medicine

## 2021-11-17 ENCOUNTER — Encounter: Payer: Self-pay | Admitting: Internal Medicine

## 2021-11-17 ENCOUNTER — Ambulatory Visit: Payer: BC Managed Care – PPO | Admitting: Internal Medicine

## 2021-11-17 VITALS — BP 132/84 | HR 86 | Temp 98.5°F | Ht 64.2 in | Wt 194.0 lb

## 2021-11-17 DIAGNOSIS — E6609 Other obesity due to excess calories: Secondary | ICD-10-CM | POA: Diagnosis not present

## 2021-11-17 DIAGNOSIS — R7303 Prediabetes: Secondary | ICD-10-CM

## 2021-11-17 DIAGNOSIS — I10 Essential (primary) hypertension: Secondary | ICD-10-CM | POA: Diagnosis not present

## 2021-11-17 DIAGNOSIS — Z6833 Body mass index (BMI) 33.0-33.9, adult: Secondary | ICD-10-CM | POA: Diagnosis not present

## 2021-11-17 MED ORDER — NORETHINDRONE 0.35 MG PO TABS: 1.0000 | ORAL_TABLET | Freq: Every day | ORAL | 11 refills | Status: AC

## 2021-11-17 NOTE — Progress Notes (Signed)
?Jeri Cos Llittleton,acting as a Neurosurgeon for Gwynneth Aliment, MD.,have documented all relevant documentation on the behalf of Gwynneth Aliment, MD,as directed by  Gwynneth Aliment, MD while in the presence of Gwynneth Aliment, MD. ] ?This visit occurred during the SARS-CoV-2 public health emergency.  Safety protocols were in place, including screening questions prior to the visit, additional usage of staff PPE, and extensive cleaning of exam room while observing appropriate contact time as indicated for disinfecting solutions. ? ?Subjective:  ?  ? Patient ID: Regina Jennings , female    DOB: 05/16/1976 , 46 y.o.   MRN: [id number] ? ? ?Chief Complaint  ?Patient presents with  ? Prediabetes  ? Hypertension  ? ? ?HPI ? ?She presents for BP check today. She reports compliance with meds. She denies headaches, chest pain and shortness of breath. She is happy to report that she has made some lifestyle changes in order to improve her health. She will be meeting with a fitness coach at [facility] and she meets with a nutritionist. She has also cut back on added sugars.  ? ?Since her last visit, she has had a colonoscopy as well. She was advised next one due in 10 years.  ? ?Hypertension ?This is a chronic problem. The current episode started more than 1 year ago. The problem has been gradually improving since onset. The problem is controlled. Pertinent negatives include no blurred vision, chest pain, palpitations or shortness of breath. Past treatments include angiotensin blockers.   ? ?Past Medical History:  ?Diagnosis Date  ? Herpes simplex without mention of complication   ? HSV 2  ? Hypertension   ? Vitamin D deficiency   ?  ? ?Family History  ?Problem Relation Age of Onset  ? Stroke Maternal Grandmother   ? Migraines Mother   ? Ulcers Mother   ? Hypertension Mother   ? Hyperlipidemia Mother   ? Irritable bowel syndrome Mother   ? GER disease Mother   ? ? ? ?Current Outpatient Medications:  ?  Cholecalciferol (VITAMIN D) 50 MCG  (2000 UT) CAPS, Take 2,000 Units by mouth See admin instructions. Takes 2,000 units every day, Disp: , Rfl:  ?  clotrimazole-betamethasone (LOTRISONE) cream, Apply 1 application topically as directed., Disp: , Rfl:  ?  diclofenac Sodium (VOLTAREN) 1 % GEL, Apply 4 g topically 4 (four) times daily., Disp: 150 g, Rfl: 2 ?  Fluocinolone Acetonide Body (DERMA-SMOOTHE/FS BODY) 0.01 % OIL, Apply to scalp daily prn, Disp: 118 mL, Rfl: 0 ?  ketoconazole (NIZORAL) 2 % shampoo, Apply to scalp daily prn, Disp: 120 mL, Rfl: 3 ?  losartan-hydrochlorothiazide (HYZAAR) 50-12.5 MG tablet, Take 1 tablet by mouth daily., Disp: 90 tablet, Rfl: 2 ?  nystatin cream (MYCOSTATIN), Apply 1 application topically 2 (two) times daily., Disp: 30 g, Rfl: 0 ?  OPZELURA 1.5 % CREA, Apply 1 application topically 2 (two) times daily., Disp: , Rfl:  ?  OVER THE COUNTER MEDICATION, Instant immunity, immune support formula 2 per day, Disp: , Rfl:  ?  pimecrolimus (ELIDEL) 1 % cream, SMARTSIG:Sparingly Topical Twice Daily, Disp: , Rfl:  ?  triamcinolone cream (KENALOG) 0.1 %, Apply topically 2 (two) times daily as needed., Disp: , Rfl:   ? ?Allergies  ?Allergen Reactions  ? Boric Acid Hives  ? Flagyl [Metronidazole]   ? Sulfa Antibiotics Rash  ? Xyzal [Cetirizine Hcl] Rash  ?  ? ?Review of Systems  ?Constitutional: Negative.   ?Eyes:  Negative for blurred  vision.  ?Respiratory: Negative.  Negative for shortness of breath.   ?Cardiovascular: Negative.  Negative for chest pain and palpitations.  ?Gastrointestinal: Negative.   ?Neurological: Negative.   ?Psychiatric/Behavioral: Negative.     ? ?Today's Vitals  ? 11/17/21 1410  ?BP: 132/84  ?Pulse: 86  ?Temp: 98.5 ?F (36.9 ?C)  ?Weight: 194 lb (88 kg)  ?Height: 5' 4.2" (1.631 m)  ?PainSc: 0-No pain  ? ?Body mass index is 33.09 kg/m?.  ?Wt Readings from Last 3 Encounters:  ?11/17/21 194 lb (88 kg)  ?10/05/21 192 lb 12.8 oz (87.5 kg)  ?09/14/21 190 lb 12.8 oz (86.5 kg)  ?  ?BP Readings from Last 3  Encounters:  ?11/17/21 132/84  ?10/05/21 120/80  ?09/14/21 122/78  ? ? ? ?Objective:  ?Physical Exam ?Vitals and nursing note reviewed.  ?Constitutional:   ?   Appearance: Normal appearance.  ?HENT:  ?   Head: Normocephalic and atraumatic.  ?Eyes:  ?   Extraocular Movements: Extraocular movements intact.  ?Cardiovascular:  ?   Rate and Rhythm: Normal rate and regular rhythm.  ?   Heart sounds: Normal heart sounds.  ?Pulmonary:  ?   Effort: Pulmonary effort is normal.  ?   Breath sounds: Normal breath sounds.  ?Musculoskeletal:  ?   Cervical back: Normal range of motion.  ?Skin: ?   General: Skin is warm.  ?Neurological:  ?   General: No focal deficit present.  ?   Mental Status: She is alert.  ?Psychiatric:     ?   Mood and Affect: Mood normal.     ?   Behavior: Behavior normal.  ?   ?Assessment And Plan:  ?   ?1. Essential hypertension, benign ?Comments: Chronic, fair control. She also advised me that her OCP has been changed to no-estrogen pill, she will start next week. F/u in Oct for CPE.  ? ?2. Prediabetes ?Comments: April labs reviewed, will defer labs until her next visit. She was congratulated for her lifestyle changes and encouraged to keep up the good work.  ? ?3. Class 1 obesity due to excess calories with serious comorbidity and body mass index (BMI) of 33.0 to 33.9 in adult ? She is encouraged to strive for BMI less than 30 to decrease cardiac risk. Advised to aim for at least 150 minutes of exercise per week.  ? ? ?Patient was given opportunity to ask questions. Patient verbalized understanding of the plan and was able to repeat key elements of the plan. All questions were answered to their satisfaction.  ? ?IF YOU HAVE BEEN REFERRED TO A SPECIALIST, IT MAY TAKE 1-2 WEEKS TO SCHEDULE/PROCESS THE REFERRAL. IF YOU HAVE NOT HEARD FROM US/SPECIALIST IN TWO WEEKS, PLEASE GIVE [name] A CALL AT [phone].  ? ?THE PATIENT IS ENCOURAGED TO PRACTICE SOCIAL DISTANCING DUE TO THE COVID-19 PANDEMIC.   ?

## 2021-11-17 NOTE — Patient Instructions (Signed)
Hypertension, Adult ?Hypertension is another name for high blood pressure. High blood pressure forces your heart to work harder to pump blood. This can cause problems over time. ?There are two numbers in a blood pressure reading. There is a top number (systolic) over a bottom number (diastolic). It is best to have a blood pressure that is below 120/80. ?What are the causes? ?The cause of this condition is not known. Some other conditions can lead to high blood pressure. ?What increases the risk? ?Some lifestyle factors can make you more likely to develop high blood pressure: ?Smoking. ?Not getting enough exercise or physical activity. ?Being overweight. ?Having too much fat, sugar, calories, or salt (sodium) in your diet. ?Drinking too much alcohol. ?Other risk factors include: ?Having any of these conditions: ?Heart disease. ?Diabetes. ?High cholesterol. ?Kidney disease. ?Obstructive sleep apnea. ?Having a family history of high blood pressure and high cholesterol. ?Age. The risk increases with age. ?Stress. ?What are the signs or symptoms? ?High blood pressure may not cause symptoms. Very high blood pressure (hypertensive crisis) may cause: ?Headache. ?Fast or uneven heartbeats (palpitations). ?Shortness of breath. ?Nosebleed. ?Vomiting or feeling like you may vomit (nauseous). ?Changes in how you see. ?Very bad chest pain. ?Feeling dizzy. ?Seizures. ?How is this treated? ?This condition is treated by making healthy lifestyle changes, such as: ?Eating healthy foods. ?Exercising more. ?Drinking less alcohol. ?Your doctor may prescribe medicine if lifestyle changes do not help enough and if: ?Your top number is above 130. ?Your bottom number is above 80. ?Your personal target blood pressure may vary. ?Follow these instructions at home: ?Eating and drinking ? ?If told, follow the DASH eating plan. To follow this plan: ?Fill one half of your plate at each meal with fruits and vegetables. ?Fill one fourth of your plate  at each meal with whole grains. Whole grains include whole-wheat pasta, brown rice, and whole-grain bread. ?Eat or drink low-fat dairy products, such as skim milk or low-fat yogurt. ?Fill one fourth of your plate at each meal with low-fat (lean) proteins. Low-fat proteins include fish, chicken without skin, eggs, beans, and tofu. ?Avoid fatty meat, cured and processed meat, or chicken with skin. ?Avoid pre-made or processed food. ?Limit the amount of salt in your diet to less than 1,500 mg each day. ?Do not drink alcohol if: ?Your doctor tells you not to drink. ?You are pregnant, may be pregnant, or are planning to become pregnant. ?If you drink alcohol: ?Limit how much you have to: ?0-1 drink a day for women. ?0-2 drinks a day for men. ?Know how much alcohol is in your drink. In the [place] one drink equals one 12 oz bottle of beer (355 mL), one 5 oz glass of wine (148 mL), or one 1? oz glass of hard liquor (44 mL). ?Lifestyle ? ?Work with your doctor to stay at a healthy weight or to lose weight. Ask your doctor what the best weight is for you. ?Get at least 30 minutes of exercise that causes your heart to beat faster (aerobic exercise) most days of the week. This may include walking, swimming, or biking. ?Get at least 30 minutes of exercise that strengthens your muscles (resistance exercise) at least 3 days a week. This may include lifting weights or doing Pilates. ?Do not smoke or use any products that contain nicotine or tobacco. If you need help quitting, ask your doctor. ?Check your blood pressure at home as told by your doctor. ?Keep all follow-up visits. ?Medicines ?Take over-the-counter and prescription medicines   only as told by your doctor. Follow directions carefully. ?Do not skip doses of blood pressure medicine. The medicine does not work as well if you skip doses. Skipping doses also puts you at risk for problems. ?Ask your doctor about side effects or reactions to medicines that you should watch  for. ?Contact a doctor if: ?You think you are having a reaction to the medicine you are taking. ?You have headaches that keep coming back. ?You feel dizzy. ?You have swelling in your ankles. ?You have trouble with your vision. ?Get help right away if: ?You get a very bad headache. ?You start to feel mixed up (confused). ?You feel weak or numb. ?You feel faint. ?You have very bad pain in your: ?Chest. ?Belly (abdomen). ?You vomit more than once. ?You have trouble breathing. ?These symptoms may be an emergency. Get help right away. Call 911. ?Do not wait to see if the symptoms will go away. ?Do not drive yourself to the hospital. ?Summary ?Hypertension is another name for high blood pressure. ?High blood pressure forces your heart to work harder to pump blood. ?For most people, a normal blood pressure is less than 120/80. ?Making healthy choices can help lower blood pressure. If your blood pressure does not get lower with healthy choices, you may need to take medicine. ?This information is not intended to replace advice given to you by your health care provider. Make sure you discuss any questions you have with your health care provider. ?Document Revised: 04/09/2021 Document Reviewed: 04/09/2021 ?Elsevier Patient Education ? 2023 [employer] ? ?

## 2021-11-19 ENCOUNTER — Telehealth: Payer: Self-pay | Admitting: Allergy

## 2021-11-19 NOTE — Telephone Encounter (Signed)
Left message for patient to call the office in regards to her questions.

## 2021-11-19 NOTE — Telephone Encounter (Signed)
Patient would like to talk to Dr. Delorse Lek, patient has questions about the documentation. Wants to speak to Dr. Delorse Lek directly.   Best contact number:[phone].

## 2021-11-24 NOTE — Telephone Encounter (Signed)
Patient received insurance paperwork in the mail that she needed to qualify for the patch test/ justification for the services. Patch test was done on 09/18/21.  Advised patient to bring paperwork to the office so that the office can exactly what the letter says. Also, advised patient to speak to [facility] the billing department to get clarification.

## 2021-11-26 ENCOUNTER — Encounter: Payer: Self-pay | Admitting: Internal Medicine

## 2022-04-19 ENCOUNTER — Encounter: Payer: BC Managed Care – PPO | Admitting: Internal Medicine

## 2022-04-23 ENCOUNTER — Encounter: Payer: BC Managed Care – PPO | Admitting: Internal Medicine

## 2022-04-28 ENCOUNTER — Other Ambulatory Visit: Payer: Self-pay | Admitting: Obstetrics and Gynecology

## 2022-04-28 DIAGNOSIS — N644 Mastodynia: Secondary | ICD-10-CM

## 2022-05-03 ENCOUNTER — Other Ambulatory Visit: Payer: Self-pay | Admitting: Obstetrics and Gynecology

## 2022-05-03 DIAGNOSIS — N644 Mastodynia: Secondary | ICD-10-CM

## 2022-06-22 ENCOUNTER — Ambulatory Visit
Admission: RE | Admit: 2022-06-22 | Discharge: 2022-06-22 | Disposition: A | Discharge status: Home / Self Care | Payer: BC Managed Care – PPO | Source: Ambulatory Visit | Attending: Obstetrics and Gynecology | Admitting: Obstetrics and Gynecology

## 2022-06-22 ENCOUNTER — Ambulatory Visit: Payer: BC Managed Care – PPO

## 2022-06-22 DIAGNOSIS — N644 Mastodynia: Secondary | ICD-10-CM

## 2022-07-10 ENCOUNTER — Other Ambulatory Visit: Payer: Self-pay | Admitting: Internal Medicine

## 2022-07-21 ENCOUNTER — Encounter: Payer: BC Managed Care – PPO | Admitting: Internal Medicine

## 2022-07-27 ENCOUNTER — Encounter: Payer: Self-pay | Admitting: Internal Medicine

## 2022-07-27 ENCOUNTER — Ambulatory Visit (INDEPENDENT_AMBULATORY_CARE_PROVIDER_SITE_OTHER): Payer: BC Managed Care – PPO | Admitting: Internal Medicine

## 2022-07-27 VITALS — BP 120/84 | HR 75 | Temp 98.3°F | Ht 64.2 in | Wt 203.0 lb

## 2022-07-27 DIAGNOSIS — L659 Nonscarring hair loss, unspecified: Secondary | ICD-10-CM | POA: Diagnosis not present

## 2022-07-27 DIAGNOSIS — F5101 Primary insomnia: Secondary | ICD-10-CM | POA: Diagnosis not present

## 2022-07-27 DIAGNOSIS — R7303 Prediabetes: Secondary | ICD-10-CM

## 2022-07-27 DIAGNOSIS — Z6834 Body mass index (BMI) 34.0-34.9, adult: Secondary | ICD-10-CM

## 2022-07-27 DIAGNOSIS — I10 Essential (primary) hypertension: Secondary | ICD-10-CM | POA: Diagnosis not present

## 2022-07-27 DIAGNOSIS — E6609 Other obesity due to excess calories: Secondary | ICD-10-CM

## 2022-07-27 DIAGNOSIS — Z79899 Other long term (current) drug therapy: Secondary | ICD-10-CM

## 2022-07-27 MED ORDER — WEGOVY 1 MG/0.5ML ~~LOC~~ SOAJ: 1.0000 mg | SUBCUTANEOUS | 0 refills | Status: DC

## 2022-07-27 NOTE — Patient Instructions (Addendum)
MAGNESIUM GLYCINATE LEMON WATER EPSOM SALT BATHS  ROYAL OILS SHAMPOO  Hypertension, Adult Hypertension is another name for high blood pressure. High blood pressure forces your heart to work harder to pump blood. This can cause problems over time. There are two numbers in a blood pressure reading. There is a top number (systolic) over a bottom number (diastolic). It is best to have a blood pressure that is below 120/80. What are the causes? The cause of this condition is not known. Some other conditions can lead to high blood pressure. What increases the risk? Some lifestyle factors can make you more likely to develop high blood pressure: Smoking. Not getting enough exercise or physical activity. Being overweight. Having too much fat, sugar, calories, or salt (sodium) in your diet. Drinking too much alcohol. Other risk factors include: Having any of these conditions: Heart disease. Diabetes. High cholesterol. Kidney disease. Obstructive sleep apnea. Having a family history of high blood pressure and high cholesterol. Age. The risk increases with age. Stress. What are the signs or symptoms? High blood pressure may not cause symptoms. Very high blood pressure (hypertensive crisis) may cause: Headache. Fast or uneven heartbeats (palpitations). Shortness of breath. Nosebleed. Vomiting or feeling like you may vomit (nauseous). Changes in how you see. Very bad chest pain. Feeling dizzy. Seizures. How is this treated? This condition is treated by making healthy lifestyle changes, such as: Eating healthy foods. Exercising more. Drinking less alcohol. Your doctor may prescribe medicine if lifestyle changes do not help enough and if: Your top number is above 130. Your bottom number is above 80. Your personal target blood pressure may vary. Follow these instructions at home: Eating and drinking  If told, follow the DASH eating plan. To follow this plan: Fill one half of your  plate at each meal with fruits and vegetables. Fill one fourth of your plate at each meal with whole grains. Whole grains include whole-wheat pasta, brown rice, and whole-grain bread. Eat or drink low-fat dairy products, such as skim milk or low-fat yogurt. Fill one fourth of your plate at each meal with low-fat (lean) proteins. Low-fat proteins include fish, chicken without skin, eggs, beans, and tofu. Avoid fatty meat, cured and processed meat, or chicken with skin. Avoid pre-made or processed food. Limit the amount of salt in your diet to less than 1,500 mg each day. Do not drink alcohol if: Your doctor tells you not to drink. You are pregnant, may be pregnant, or are planning to become pregnant. If you drink alcohol: Limit how much you have to: 0-1 drink a day for women. 0-2 drinks a day for men. Know how much alcohol is in your drink. In the [place] one drink equals one 12 oz bottle of beer (355 mL), one 5 oz glass of wine (148 mL), or one 1 oz glass of hard liquor (44 mL). Lifestyle  Work with your doctor to stay at a healthy weight or to lose weight. Ask your doctor what the best weight is for you. Get at least 30 minutes of exercise that causes your heart to beat faster (aerobic exercise) most days of the week. This may include walking, swimming, or biking. Get at least 30 minutes of exercise that strengthens your muscles (resistance exercise) at least 3 days a week. This may include lifting weights or doing Pilates. Do not smoke or use any products that contain nicotine or tobacco. If you need help quitting, ask your doctor. Check your blood pressure at home as told by  your doctor. Keep all follow-up visits. Medicines Take over-the-counter and prescription medicines only as told by your doctor. Follow directions carefully. Do not skip doses of blood pressure medicine. The medicine does not work as well if you skip doses. Skipping doses also puts you at risk for problems. Ask your  doctor about side effects or reactions to medicines that you should watch for. Contact a doctor if: You think you are having a reaction to the medicine you are taking. You have headaches that keep coming back. You feel dizzy. You have swelling in your ankles. You have trouble with your vision. Get help right away if: You get a very bad headache. You start to feel mixed up (confused). You feel weak or numb. You feel faint. You have very bad pain in your: Chest. Belly (abdomen). You vomit more than once. You have trouble breathing. These symptoms may be an emergency. Get help right away. Call 911. Do not wait to see if the symptoms will go away. Do not drive yourself to the hospital. Summary Hypertension is another name for high blood pressure. High blood pressure forces your heart to work harder to pump blood. For most people, a normal blood pressure is less than 120/80. Making healthy choices can help lower blood pressure. If your blood pressure does not get lower with healthy choices, you may need to take medicine. This information is not intended to replace advice given to you by your health care provider. Make sure you discuss any questions you have with your health care provider. Document Revised: 04/09/2021 Document Reviewed: 04/09/2021 Elsevier Patient Education  [place].

## 2022-07-27 NOTE — Progress Notes (Signed)
Rich Brave Llittleton,acting as a Education administrator for Maximino Greenland, MD.,have documented all relevant documentation on the behalf of Maximino Greenland, MD,as directed by  Maximino Greenland, MD while in the presence of Maximino Greenland, MD.    Subjective:     Patient ID: Regina Jennings , female    DOB: 06-02-1976 , 47 y.o.   MRN: [id number]   Chief Complaint  Patient presents with   Hypertension    HPI  She presents for BP check today. She reports compliance with meds. She denies headaches, chest pain and shortness of breath. Patient would also like to discuss some options to help her losing weight.   Hypertension This is a chronic problem. The current episode started more than 1 year ago. The problem has been gradually improving since onset. The problem is controlled. Pertinent negatives include no blurred vision, chest pain, palpitations or shortness of breath. Past treatments include angiotensin blockers.     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 Takes 2,000 units every day, Disp: , Rfl:    losartan-hydrochlorothiazide (HYZAAR) 50-12.5 MG tablet, TAKE 1 TABLET BY MOUTH DAILY, Disp: 90 tablet, Rfl: 2   norethindrone (ORTHO MICRONOR) 0.35 MG tablet, Take 1 tablet (0.35 mg total) by mouth daily., Disp: 28 tablet, Rfl: 11   Semaglutide-Weight Management (WEGOVY) 1 MG/0.5ML SOAJ, Inject 1 mg into the skin once a week., Disp: 2 mL, Rfl: 0   diclofenac Sodium (VOLTAREN) 1 % GEL, Apply 4 g  topically 4 (four) times daily. (Patient not taking: Reported on 07/27/2022), Disp: 150 g, Rfl: 2   Fluocinolone Acetonide Body (DERMA-SMOOTHE/FS BODY) 0.01 % OIL, Apply to scalp daily prn (Patient not taking: Reported on 07/27/2022), Disp: 118 mL, Rfl: 0   ketoconazole (NIZORAL) 2 % shampoo, Apply to scalp daily prn (Patient not taking: Reported on 07/27/2022), Disp: 120 mL, Rfl: 3   nystatin cream (MYCOSTATIN), Apply 1 application topically 2 (two) times daily. (Patient not taking: Reported on 07/27/2022), Disp: 30 g, Rfl: 0   OPZELURA 1.5 % CREA, Apply 1 application topically 2 (two) times daily. (Patient not taking: Reported on 07/27/2022), Disp: , Rfl:    OVER THE COUNTER MEDICATION, Instant immunity, immune support formula 2 per day (Patient not taking: Reported on 07/27/2022), Disp: , Rfl:    pimecrolimus (ELIDEL) 1 % cream, SMARTSIG:Sparingly Topical Twice Daily (Patient not taking: Reported on 07/27/2022), Disp: , Rfl:    triamcinolone cream (KENALOG) 0.1 %, Apply topically 2 (two) times daily as needed. (Patient not taking: Reported on 07/27/2022), Disp: , Rfl:    Allergies  Allergen Reactions   Boric Acid Hives   Flagyl [Metronidazole]    Sulfa Antibiotics Rash   Xyzal [Cetirizine Hcl] Rash     Review of Systems  Constitutional:  Positive for unexpected weight change.  Eyes: Negative.  Negative for blurred  vision.  Respiratory: Negative.  Negative for shortness of breath.   Cardiovascular: Negative.  Negative for chest pain and palpitations.  Musculoskeletal: Negative.   Skin: Negative.        She c/o hair loss. She is followed by Payton Mccallum.   Neurological: Negative.   Psychiatric/Behavioral:  Positive for sleep disturbance.      Today's Vitals   07/27/22 1416  BP: 120/84  Pulse: 75  Temp: 98.3 F (36.8 C)  Weight: 203 lb (92.1 kg)  Height: 5' 4.2" (1.631 m)  PainSc: 0-No pain   Body mass index is 34.63 kg/m.  Wt Readings from Last 3 Encounters:  07/27/22 203 lb (92.1 kg)   11/17/21 194 lb (88 kg)  10/05/21 192 lb 12.8 oz (87.5 kg)     Objective:  Physical Exam Vitals and nursing note reviewed.  Constitutional:      Appearance: Normal appearance. She is obese.  HENT:     Head: Normocephalic and atraumatic.     Nose:     Comments: MASKED     Mouth/Throat:     Comments: MASKED  Eyes:     Extraocular Movements: Extraocular movements intact.  Cardiovascular:     Rate and Rhythm: Normal rate and regular rhythm.     Heart sounds: Normal heart sounds.  Pulmonary:     Effort: Pulmonary effort is normal.     Breath sounds: Normal breath sounds.  Musculoskeletal:     Cervical back: Normal range of motion.  Skin:    General: Skin is warm.  Neurological:     General: No focal deficit present.     Mental Status: She is alert.  Psychiatric:        Mood and Affect: Mood normal.        Behavior: Behavior normal.      Assessment And Plan:     1. Essential hypertension, benign Comments: Chronic, well controlled. She will c/w losartan/hct 50/12.5mg  daily. She is encouraged to follow low sodium diet. F/u 4 months for CPE. - CMP14+EGFR  2. Prediabetes Comments: Her a1c has been elevated in the past. I will recheck an a1c today, encouraged to limit intake of sugary beverages/foods and processed meats. - Hemoglobin A1c - CMP14+EGFR  3. Primary insomnia Comments: Chronic, encouraged to develop a bedtime routine. Encouraged to consider MG supplementation, both topically and orally. IF persistent, will consider rx.  4. Hair loss Comments: She is followed by Payton Mccallum. I will check iron levels. I will treat accordingly. - Iron, TIBC and Ferritin Panel - CBC no Diff - TSH  5. Class 1 obesity due to excess calories with serious comorbidity and body mass index (BMI) of 34.0 to 34.9 in adult Comments: She has gained 9lbs since her May 2023.  Encouraged to aim for at least 150 minutes of exercise per week.  We discussed the use of Wegovy to address obesity. She  denies personal/family history of thyroid cancer. I will send rx to see if her insurance will cover the medication. If not, we will consider a different class of medication. She verbally acknowledges treatment plan. She is aware that it may take up to two weeks to see if the medication is covered.   6. Drug therapy - Vitamin B12   Patient was given opportunity to ask questions. Patient verbalized understanding of the plan and was able to repeat key elements of the plan. All questions were answered to their satisfaction.   I, Maximino Greenland, MD, have reviewed all documentation for  this visit. The documentation on 07/27/22 for the exam, diagnosis, procedures, and orders are all accurate and complete.   IF YOU HAVE BEEN REFERRED TO A SPECIALIST, IT MAY TAKE 1-2 WEEKS TO SCHEDULE/PROCESS THE REFERRAL. IF YOU HAVE NOT HEARD FROM US/SPECIALIST IN TWO WEEKS, PLEASE GIVE [name] A CALL AT [phone].   THE PATIENT IS ENCOURAGED TO PRACTICE SOCIAL DISTANCING DUE TO THE COVID-19 PANDEMIC.

## 2022-07-28 LAB — CMP14+EGFR
ALT: 16 IU/L (ref 0–32)
AST: 19 IU/L (ref 0–40)
Albumin/Globulin Ratio: 1.6 (ref 1.2–2.2)
Albumin: 4.5 g/dL (ref 3.9–4.9)
Alkaline Phosphatase: 78 IU/L (ref 44–121)
BUN/Creatinine Ratio: 14 (ref 9–23)
BUN: 15 mg/dL (ref 6–24)
Bilirubin Total: <0.2 mg/dL (ref 0.0–1.2)
CO2: 24 mmol/L (ref 20–29)
Calcium: 9.4 mg/dL (ref 8.7–10.2)
Chloride: 100 mmol/L (ref 96–106)
Creatinine, Ser: 1.05 mg/dL — ABNORMAL HIGH (ref 0.57–1.00)
Globulin, Total: 2.8 g/dL (ref 1.5–4.5)
Glucose: 94 mg/dL (ref 70–99)
Potassium: 4 mmol/L (ref 3.5–5.2)
Sodium: 139 mmol/L (ref 134–144)
Total Protein: 7.3 g/dL (ref 6.0–8.5)
eGFR: 66 mL/min/{1.73_m2} (ref >59)

## 2022-07-28 LAB — IRON,TIBC AND FERRITIN PANEL
Ferritin: 92 ng/mL (ref 15–150)
Iron Saturation: 15 % (ref 15–55)
Iron: 55 ug/dL (ref 27–159)
Total Iron Binding Capacity: 363 ug/dL (ref 250–450)
UIBC: 308 ug/dL (ref 131–425)

## 2022-07-28 LAB — CBC
Hematocrit: 40.3 % (ref 34.0–46.6)
Hemoglobin: 13.1 g/dL (ref 11.1–15.9)
MCH: 25.9 pg — ABNORMAL LOW (ref 26.6–33.0)
MCHC: 32.5 g/dL (ref 31.5–35.7)
MCV: 80 fL (ref 79–97)
Platelets: 314 10*3/uL (ref 150–450)
RBC: 5.06 x10E6/uL (ref 3.77–5.28)
RDW: 13.6 % (ref 11.7–15.4)
WBC: 9 10*3/uL (ref 3.4–10.8)

## 2022-07-28 LAB — VITAMIN B12: Vitamin B-12: 336 pg/mL (ref 232–1245)

## 2022-07-28 LAB — HEMOGLOBIN A1C
Est. average glucose Bld gHb Est-mCnc: 123 mg/dL
Hgb A1c MFr Bld: 5.9 % — ABNORMAL HIGH (ref 4.8–5.6)

## 2022-07-28 LAB — TSH: TSH: 1.43 u[IU]/mL (ref 0.450–4.500)

## 2022-08-01 DIAGNOSIS — F5101 Primary insomnia: Secondary | ICD-10-CM | POA: Insufficient documentation

## 2022-08-01 DIAGNOSIS — L659 Nonscarring hair loss, unspecified: Secondary | ICD-10-CM | POA: Insufficient documentation

## 2022-08-01 DIAGNOSIS — R7303 Prediabetes: Secondary | ICD-10-CM | POA: Insufficient documentation

## 2022-08-04 ENCOUNTER — Other Ambulatory Visit: Payer: Self-pay

## 2022-08-04 MED ORDER — SAXENDA 18 MG/3ML ~~LOC~~ SOPN: 3.0000 mg | PEN_INJECTOR | Freq: Every day | SUBCUTANEOUS | 0 refills | Status: DC

## 2022-10-14 ENCOUNTER — Telehealth (INDEPENDENT_AMBULATORY_CARE_PROVIDER_SITE_OTHER): Payer: BC Managed Care – PPO | Admitting: Nurse Practitioner

## 2022-10-14 DIAGNOSIS — E6609 Other obesity due to excess calories: Secondary | ICD-10-CM

## 2022-10-14 DIAGNOSIS — Z6833 Body mass index (BMI) 33.0-33.9, adult: Secondary | ICD-10-CM

## 2022-10-14 DIAGNOSIS — R0683 Snoring: Secondary | ICD-10-CM | POA: Diagnosis not present

## 2022-10-14 MED ORDER — BUPROPION HCL ER (XL) 150 MG PO TB24: 150.0000 mg | ORAL_TABLET | ORAL | 2 refills | Status: AC

## 2022-10-14 NOTE — Progress Notes (Signed)
Virtual Visit via Mychart   This visit type was conducted due to national recommendations for restrictions regarding the COVID-19 Pandemic (e.g. social distancing) in an effort to limit this patient's exposure and mitigate transmission in our community.  Due to her co-morbid illnesses, this patient is at least at moderate risk for complications without adequate follow up.  This format is felt to be most appropriate for this patient at this time.  All issues noted in this document were discussed and addressed.  A limited physical exam was performed with this format.    This visit type was conducted due to national recommendations for restrictions regarding the COVID-19 Pandemic (e.g. social distancing) in an effort to limit this patient's exposure and mitigate transmission in our community.  Patients identity confirmed using two different identifiers.  This format is felt to be most appropriate for this patient at this time.  All issues noted in this document were discussed and addressed.  No physical exam was performed (except for noted visual exam findings with Video Visits).    Date:  10/28/2022   ID:  Regina Jennings, DOB 1976/06/03, MRN [id number]  Patient Location:  Outside - spoke with Beverley Fiedler  Provider location:   Office    Chief Complaint:  discuss weight loss options  History of Present Illness:    Regina Jennings is a 47 y.o. female who presents via video conferencing for a telehealth visit today.    The patient does not have symptoms concerning for COVID-19 infection (fever, chills, cough, or new shortness of breath).   Pt presents today wanting to further educated for Wellbutrin for weight loss. She has never been on any medications for her weight. She has struggling for years on her weight. She has been seeing a nutritionist - lifestyle change. She is exercising 3 days a week and other activities such as skating and line dancing until she injured her ankle. She had  a weighted hoola hoop and different apps. 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 She does have low energy and would like to improve. She is noticing she is snoring but thinks she can hear herself snoring and having to take a deep breath when she is sleeping on her back. When she is on her side she does better. She does not feel she is getting the quality of sleep. She has had an in home sleep study during covid was borderline type results. Has not been able to go in for the overnight study    She has a goal of losing 30 lbs initially.   She would like to begin medication.   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   Cholecalciferol (VITAMIN D) 50 MCG (2000 UT) CAPS Take 2,000 Units by mouth See admin instructions. Takes 2,000 units every day   losartan-hydrochlorothiazide (HYZAAR) 50-12.5 MG tablet TAKE 1 TABLET BY MOUTH DAILY   norethindrone (ORTHO MICRONOR) 0.35 MG tablet Take 1 tablet (0.35 mg total) by mouth daily.     Allergies:   Boric acid, Flagyl [metronidazole], Sulfa antibiotics, and Xyzal [cetirizine hcl]   Social History   Tobacco Use   Smoking status: Never   Smokeless tobacco: Never  Vaping Use   Vaping Use: Never used  Substance Use Topics   Alcohol use: Yes   Drug use: No      Family Hx: The patient's family history includes Breast cancer in her mother; GER disease in her mother; Hyperlipidemia in her mother; Hypertension in her mother; Irritable bowel syndrome in her mother; Migraines in her mother; Stroke in her maternal grandmother; Ulcers in her mother.  ROS:   Please see the history of present illness.    Review of Systems  Constitutional: Negative.   Respiratory: Negative.    Cardiovascular: Negative.   Gastrointestinal: Negative.   Neurological: Negative.   Endo/Heme/Allergies: Negative.   Psychiatric/Behavioral: Negative.      All other systems reviewed and are negative.   Labs/Other Tests and Data Reviewed:    Recent Labs: 07/27/2022: ALT 16; BUN 15; Creatinine, Ser 1.05; Hemoglobin 13.1; Platelets 314; Potassium 4.0; Sodium 139; TSH 1.430   Recent Lipid Panel Lab Results  Component Value Date/Time   CHOL 203 (H) 04/06/2021 10:16 AM   TRIG 73 04/06/2021 10:16 AM   HDL 61 04/06/2021 10:16 AM   CHOLHDL 3.3 04/06/2021 10:16 AM   LDLCALC 129 (H) 04/06/2021 10:16 AM    Wt Readings from Last 3 Encounters:  07/27/22 203 lb (92.1 kg)  11/17/21 194 lb (88 kg)  10/05/21 192 lb 12.8 oz (87.5 kg)     Exam:    Vital Signs:  There were no vitals taken for this visit.    Physical Exam Vitals reviewed.  Constitutional:      General: She is not in acute distress.    Appearance: Normal appearance.  Pulmonary:     Effort: Pulmonary effort is normal. No respiratory distress.  Neurological:     General: No focal deficit present.     Mental Status: She is alert and oriented to person, place, and time. Mental status is at baseline.     Cranial Nerves: No cranial nerve deficit.  Psychiatric:        Mood and Affect: Mood and affect normal.        Behavior: Behavior normal.        Thought Content: Thought content normal.        Cognition and Memory: Memory normal.        Judgment: Judgment normal.     ASSESSMENT & PLAN:    Snoring - Her  for sleep study this may be a sign of sleep apnea and may be hindering her ability to lose weight. - Plan: Ambulatory referral to Sleep Studies  Class 1 obesity due to excess calories with serious comorbidity and body mass index (BMI) of 33.0 to 33.9 in adult - Encouraged to increase physical activity to at least 150/week and eat a healthy diet.  Discussed how Wellbutrin has been used to help with weight loss. Discussed side effects and advised to not abruptly stop taking medications - Plan: Ambulatory referral to Sleep Studies, buPROPion [facility]  XL) 150 MG 24 hr tablet   COVID-19 Education: The signs and symptoms of COVID-19 were discussed with the patient and how to seek care for testing (follow up with PCP or arrange E-visit).  The importance of social distancing was discussed today.  Patient Risk:   After full review of this patients clinical status, I feel that they are at least moderate risk at this time.  Time:   Today, I have spent 18 minutes/ seconds with the patient with telehealth technology discussing above diagnoses.     Medication Adjustments/Labs and Tests Ordered: Current medicines are reviewed at length with the patient today.  Concerns regarding medicines are outlined above.   Tests Ordered: Orders Placed This Encounter  Procedures   Ambulatory referral to Sleep Studies    Medication Changes: Meds ordered this encounter  Medications   buPROPion (WELLBUTRIN XL) 150 MG 24 hr tablet    Sig: Take 1 tablet (150 mg total) by mouth every morning.    Dispense:  30 tablet    Refill:  2    Disposition:  Follow up in PRN, has upcoming appt in May    Signed, Arnette FeltsJanece Makaiyah Schweiger, FNP

## 2022-10-14 NOTE — Patient Instructions (Signed)

## 2022-10-28 ENCOUNTER — Encounter: Payer: Self-pay | Admitting: Nurse Practitioner

## 2022-11-23 ENCOUNTER — Ambulatory Visit: Payer: BC Managed Care – PPO | Admitting: Neurology

## 2022-11-23 ENCOUNTER — Encounter: Payer: Self-pay | Admitting: Neurology

## 2022-11-23 VITALS — BP 132/74 | HR 66 | Ht 63.5 in | Wt 199.0 lb

## 2022-11-23 DIAGNOSIS — G4719 Other hypersomnia: Secondary | ICD-10-CM

## 2022-11-23 DIAGNOSIS — E669 Obesity, unspecified: Secondary | ICD-10-CM

## 2022-11-23 DIAGNOSIS — R635 Abnormal weight gain: Secondary | ICD-10-CM

## 2022-11-23 DIAGNOSIS — R0683 Snoring: Secondary | ICD-10-CM | POA: Diagnosis not present

## 2022-11-23 DIAGNOSIS — R351 Nocturia: Secondary | ICD-10-CM

## 2022-11-23 DIAGNOSIS — Z9189 Other specified personal risk factors, not elsewhere classified: Secondary | ICD-10-CM

## 2022-11-23 NOTE — Patient Instructions (Signed)
  Based on your symptoms and your exam I believe you may be at risk for obstructive sleep apnea (aka OSA). We should proceed with a sleep study to determine whether you do or do not have OSA and how severe it is. Even, if you have mild OSA, I may want you to consider treatment with CPAP, as treatment of even borderline or mild sleep apnea can result and improvement of symptoms such as sleep disruption, daytime sleepiness, nighttime bathroom breaks, restless leg symptoms, improvement of headache syndromes, even improved mood disorder.   As explained, an attended sleep study (meaning you get to stay overnight in the sleep lab), lets [name] monitor sleep-related behaviors such as sleep talking and leg movements in sleep, in addition to monitoring for sleep apnea.  A home sleep test is a screening tool for sleep apnea diagnosis only, but unfortunately, does not help with any other sleep-related diagnoses.  Please remember, the long-term risks and ramifications of untreated moderate to severe obstructive sleep apnea may include (but are not limited to): increased risk for cardiovascular disease, including congestive heart failure, stroke, difficult to control hypertension, treatment resistant obesity, arrhythmias, especially irregular heartbeat commonly known as A. Fib. (atrial fibrillation); even type 2 diabetes has been linked to untreated OSA.   Other correlations that untreated obstructive sleep apnea include macular edema which is swelling of the retina in the eyes, droopy eyelid syndrome, and elevated hemoglobin and hematocrit levels (often referred to as polycythemia).  Sleep apnea can cause disruption of sleep and sleep deprivation in most cases, which, in turn, can cause recurrent headaches, problems with memory, mood, concentration, focus, and vigilance. Most people with untreated sleep apnea report excessive daytime sleepiness, which can affect their ability to drive. Please do not drive or use heavy  equipment or machinery, if you feel sleepy! Patients with sleep apnea can also develop difficulty initiating and maintaining sleep (aka insomnia).   Having sleep apnea may increase your risk for other sleep disorders, including involuntary behaviors sleep such as sleep terrors, sleep talking, sleepwalking.    Having sleep apnea can also increase your risk for restless leg syndrome and leg movements at night.   Please note that untreated obstructive sleep apnea may carry additional perioperative morbidity. Patients with significant obstructive sleep apnea (typically, in the moderate to severe degree) should receive, if possible, perioperative PAP (positive airway pressure) therapy and the surgeons and particularly the anesthesiologists should be informed of the diagnosis and the severity of the sleep disordered breathing.   We will call you or email you through MyChart with regards to your test results and plan a follow-up in sleep clinic accordingly. Most likely, you will hear from one of our nurses.   Our sleep lab administrative assistant will call you to schedule your sleep study and give you further instructions, regarding the check in process for the sleep study, arrival time, what to bring, when you can expect to leave after the study, etc., and to answer any other logistical questions you may have. If you don't hear back from her by about 2 weeks from now, please feel free to call her direct line at [phone] or you can call our general clinic number, or email [name] through My Chart.

## 2022-11-23 NOTE — Progress Notes (Signed)
Subjective:    Patient ID: Regina Jennings is a 47 y.o. female.  HPI    Huston Foley, MD, PhD [facility] [address], [address] [address] [place], [place] [address]  Dear [name],  I saw your patient, Regina Jennings, upon your kind request in my sleep clinic today for consultation of her sleep disorder, in particular, concern for underlying obstructive sleep apnea.  The patient is unaccompanied today.  As you know, Regina Jennings is a 47 year old female with an underlying medical history of hypertension, vitamin D deficiency, and obesity, who reports snoring and excessive daytime somnolence, nonrestorative sleep and sleep disruption.  Her Epworth sleepiness score is 12 out of 24, fatigue severity score is 54 out of 63.  I reviewed your telehealth visit note from 10/14/2022.  She lives with her daughter, age 63.  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  She does not drink caffeine daily.  She drinks alcohol rarely.  She does not smoke.  Bedtime is generally around 10:30 PM and rise time around 6 AM.  She has nocturia once per average night, denies recurrent morning headaches.  She has had some weight fluctuation, she is working on weight loss, has not been on Wegovy and has not yet started Wellbutrin.  She would be interested in pursuing a laboratory attended sleep study and she would consider PAP therapy if she has sleep apnea.  I had evaluated her a few years ago for sleep apnea concerns.  She had a viral sleep test on 03/28/2019 which showed an AHI of 6/h, O2 nadir 91%.  She was offered AutoPap therapy at the time but decided to forego PAP therapy.    She has had interim weight gain. I had also evaluated her in 2021 for recurrent headaches.  These were felt to be not migrainous and more in line with tension headaches.  I suggested we proceed with a brain MRI with and without contrast.  She did not pursue it at the time.  She was advised to proceed with a formal,  full eye exam.  Previously:  08/29/2019: 47 year old right-handed woman with an underlying medical history of vertigo, hypertension, vitamin D deficiency, obesity, and mild obstructive sleep apnea, who reports intermittent foggy headedness.  She feels off balance at times with something shifting inside her head.  It is similar to her vertigo but not as pronounced.  She does have some intermittent headaches, no prior history of headaches or migraines, headache started also around the time when she first had a bout of vertigo in October 2020.  Sometimes she has a bitemporal squeezing or heaviness sensation.  She denies any sudden onset of one-sided weakness or numbness or tingling or droopy face or slurring of speech.  Her father has been on meclizine daily for vertigo as I understand.  She is wondering if she should try it.  She recently tried a pain medication through samples from her primary care physician, Norel AD, but had side effects, felt drugged.  She has no associated nausea or vomiting or photophobia or sonophobia.  She has not fallen.  She had a fleeting sense of spinning sensation recently last week.  Headaches overall are better this past week.  She typically does not try any over-the-counter medication such as Tylenol or Aleve.  She tries to hydrate well, has not been drinking any caffeine on a regular basis, lately just a little bit more because it was in the house.  She does  not drink any alcohol on a regular basis, in fact, has not had any in the past 3 months. I have seen before for sleep evaluation last year.  She had a home sleep test on 03/28/2019 which indicated borderline obstructive sleep apnea with an AHI of 6/h, O2 nadir of 91%.  I reviewed your office note from 08/03/2019.  She had a recent bout of vertigo and Epley maneuver helped.  She has had some blurry vision, she is planning to see an ophthalmologist.  She feels the need to squint at times, no double vision.  She made an appointment  to see an ophthalmologist next month. She has not been sleeping very well.  Sleep is interrupted.  She attributes this to avoiding sleeping on her right side because when she started having the vertigo she had slept on the right side and now she is afraid to lay on her right side. Her head pressure feeling can be nearly daily.  It does not tend to get very intense, tends to linger around a 3 out of 10 on a pain scale.   03/14/2019: 47 year old right-handed woman with an underlying medical history of hypertension, vitamin D deficiency, and obesity, who reports snoring and excessive daytime somnolence.  I reviewed your office note from 02/27/2019. Her Epworth sleepiness score is 15 out of 24, fatigue severity score is 34 out of 63.  She is single and lives with her daughter, she works at [employer], as Teacher, English as a foreign language, currently works from home and a more variable sleep schedule.  Her daughter is 28 and also attends [employer]. Patient's bedtime is currently around 11, rise time around 8.  She has nocturia once per average night, she denies any recurrent morning headaches, she is not aware of any family history of OSA.  She does not wake up rested and does not get good consolidated sleep, it seems like she consistently wakes up after the first 4 hours and then does not get any good deep sleep afterwards.  She is working on weight loss.  She is a non-smoker and drinks alcohol occasionally, no daily caffeine either. Lately, she has noted the need to take some deeper breaths when she is awake.  She feels like she does not always breathe deeply enough.       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   Hypertension    Vitamin D deficiency     Her Past Surgical History Is Significant For: Past Surgical History:  Procedure Laterality Date   WISDOM TOOTH EXTRACTION      Her Family History Is Significant For: Family  History  Problem Relation Age of Onset   Migraines Mother    Ulcers Mother    Hypertension Mother    Hyperlipidemia Mother    Irritable bowel syndrome Mother    GER disease Mother    Breast cancer Mother        78's   Hypertension Maternal Grandmother    Stroke Maternal Grandmother    Congestive Heart Failure Maternal Grandmother    Breast cancer Paternal Grandmother    Sleep apnea Neg Hx     Her Social History Is Significant For: Social History   Socioeconomic History   Marital status: Single    Spouse name: Not on file   Number of children: 1   Years of education: Not on file   Highest education level:  Master's degree (e.g., MA, MS, MEng, MEd, MSW, MBA)  Occupational History   Not on file  Tobacco Use   Smoking status: Never   Smokeless tobacco: Never  Vaping Use   Vaping Use: Never used  Substance and Sexual Activity   Alcohol use: Yes    Comment: seldom, maybe twice a year   Drug use: No   Sexual activity: Yes    Birth control/protection: Pill    Comment: JOLESSA  Other Topics Concern   Not on file  Social History Narrative   Lives at home with daughter   Caffeine: in green tea    Social Determinants of Health   Financial Resource Strain: Not on file  Food Insecurity: Not on file  Transportation Needs: Not on file  Physical Activity: Not on file  Stress: Not on file  Social Connections: Not on file    Her Allergies Are:  Allergies  Allergen Reactions   Boric Acid Hives   Flagyl [Metronidazole]    Sulfa Antibiotics Rash   Xyzal [Cetirizine Hcl] Rash  :   Her Current Medications Are:  Outpatient Encounter Medications as of 11/23/2022  Medication Sig   buPROPion (WELLBUTRIN XL) 150 MG 24 hr tablet Take 1 tablet (150 mg total) by mouth every morning.   Cholecalciferol (VITAMIN D) 50 MCG (2000 UT) CAPS Take 2,000 Units by mouth See admin instructions. Takes 2,000 units every day   losartan-hydrochlorothiazide (HYZAAR) 50-12.5 MG tablet TAKE 1 TABLET  BY MOUTH DAILY   MAGNESIUM GLYCINATE PO Take by mouth as needed.   norethindrone (ORTHO MICRONOR) 0.35 MG tablet Take 1 tablet (0.35 mg total) by mouth daily.   diclofenac Sodium (VOLTAREN) 1 % GEL Apply 4 g topically 4 (four) times daily. (Patient not taking: Reported on 07/27/2022)   Fluocinolone Acetonide Body (DERMA-SMOOTHE/FS BODY) 0.01 % OIL Apply to scalp daily prn (Patient not taking: Reported on 07/27/2022)   Liraglutide -Weight Management (SAXENDA) 18 MG/3ML SOPN Inject 3 mg into the skin daily. (Patient not taking: Reported on 10/14/2022)   nystatin cream (MYCOSTATIN) Apply 1 application topically 2 (two) times daily. (Patient not taking: Reported on 07/27/2022)   OPZELURA 1.5 % CREA Apply 1 application topically 2 (two) times daily. (Patient not taking: Reported on 07/27/2022)   pimecrolimus (ELIDEL) 1 % cream SMARTSIG:Sparingly Topical Twice Daily (Patient not taking: Reported on 07/27/2022)   Semaglutide-Weight Management (WEGOVY) 1 MG/0.5ML SOAJ Inject 1 mg into the skin once a week. (Patient not taking: Reported on 10/14/2022)   triamcinolone cream (KENALOG) 0.1 % Apply topically 2 (two) times daily as needed. (Patient not taking: Reported on 07/27/2022)   [DISCONTINUED] ketoconazole (NIZORAL) 2 % shampoo Apply to scalp daily prn (Patient not taking: Reported on 07/27/2022)   [DISCONTINUED] OVER THE COUNTER MEDICATION Instant immunity, immune support formula 2 per day (Patient not taking: Reported on 07/27/2022)   No facility-administered encounter medications on file as of 11/23/2022.  :   Review of Systems:  Out of a complete 14 point review of systems, all are reviewed and negative with the exception of these symptoms as listed below:  Review of Systems  Neurological:        Patient is here alone for sleep consult. Patient reports she has trouble staying asleep but no trouble falling asleep. She states she doesn't get good REM sleep and she still feels tired when she wakes up. She  believes she snores but is unsure how consistent it is. She had a sleep study at home in 2021.  ESS 12 FSS 54.     Objective:  Neurological Exam  Physical Exam Physical Examination:   Vitals:   11/23/22 1401  BP: 132/74  Pulse: 66    General Examination: The patient is a very pleasant 47 y.o. female in no acute distress. She appears well-developed and well-nourished and well groomed.   HEENT: Normocephalic, atraumatic, pupils are equal, round and reactive to light. Extraocular tracking is well preserved, hearing is grossly intact. Face is symmetric with normal facial animation. Speech is clear with no dysarthria noted. There is no hypophonia. There is no lip, neck/head, jaw or voice tremor. Neck is supple with full range of passive and active motion. There are no carotid bruits on auscultation. Oropharynx exam reveals: mild mouth dryness, good dental hygiene and mild airway crowding, tongue protrudes centrally in palate elevates symmetrically.  Mallampati class II.  Neck circumference 15 3/8 inches.  She has a mild overbite.   Chest: Clear to auscultation without wheezing, rhonchi or crackles noted.   Heart: S1+S2+0, regular and normal without murmurs, rubs or gallops noted.    Abdomen: Soft, non-tender and non-distended with normal bowel sounds appreciated on auscultation.   Extremities: There is no pitting edema in the distal lower extremities bilaterally.   Skin: Warm and dry without trophic changes noted.   Musculoskeletal: exam reveals no obvious joint deformities.    Neurologically:  Mental status: The patient is awake, alert and oriented in all 4 spheres. Her immediate and remote memory, attention, language skills and fund of knowledge are appropriate. There is no evidence of aphasia, agnosia, apraxia or anomia. Speech is clear with normal prosody and enunciation. Thought process is linear. Mood is normal and affect is normal.  Cranial nerves II - XII are as described above  under HEENT exam.  Motor exam: Normal bulk, strength and tone is noted. Fine motor skills and coordination: Grossly intact.  Cerebellar testing: No dysmetria or intention tremor. There is no truncal or gait ataxia.  Sensory exam: intact to light touch in the upper and lower extremities.  Gait, station and balance: She stands easily. No veering to one side is noted. No leaning to one side is noted. Posture is age-appropriate and stance is narrow based. Gait shows normal stride length and normal pace. No problems turning are noted.    Assessment and Plan:  In summary, Regina Jennings is a very pleasant 47 y.o.-year old female 47 year old female with an underlying medical history of hypertension, vitamin D deficiency, and obesity, whose history and physical exam are concerning for sleep disordered breathing, particularly obstructive sleep apnea (OSA).  She had a home sleep test which showed borderline sleep apnea in 2020.  She has had interim weight gain. A laboratory attended sleep study is typically considered "gold standard" for evaluation of sleep disordered breathing; she would like to proceed with a laboratory attended sleep study at this time.   I had a long chat with the patient about my findings and the diagnosis of sleep apnea, particularly OSA, its prognosis and treatment options. We talked about medical/conservative treatments, surgical interventions and non-pharmacological approaches for symptom control. I explained, in particular, the risks and ramifications of untreated moderate to severe OSA, especially with respect to developing cardiovascular disease down the road, including congestive heart failure (CHF), difficult to treat hypertension, cardiac arrhythmias (particularly A-fib), neurovascular complications including TIA, stroke and dementia. Even type 2 diabetes has, in part, been linked to untreated OSA. Symptoms of untreated OSA may include (but may not  be limited to) daytime sleepiness,  nocturia (i.e. frequent nighttime urination), memory problems, mood irritability and suboptimally controlled or worsening mood disorder such as depression and/or anxiety, lack of energy, lack of motivation, physical discomfort, as well as recurrent headaches, especially morning or nocturnal headaches. We talked about the importance of maintaining a healthy lifestyle and striving for healthy weight. In addition, we talked about the importance of striving for and maintaining good sleep hygiene. I recommended a sleep study at this time. I outlined the differences between a laboratory attended sleep study which is considered more comprehensive and accurate over the option of a home sleep test (HST); the latter may lead to underestimation of sleep disordered breathing in some instances and does not help with diagnosing upper airway resistance syndrome and is not accurate enough to diagnose primary central sleep apnea typically. I outlined possible surgical and non-surgical treatment options of OSA, including the use of a positive airway pressure (PAP) device (i.e. CPAP, AutoPAP/APAP or BiPAP in certain circumstances), a custom-made dental device (aka oral appliance, which would require a referral to a specialist dentist or orthodontist typically, and is generally speaking not considered for patients with full dentures or edentulous state), upper airway surgical options, such as traditional UPPP (which is not considered a first-line treatment) or the Inspire device (hypoglossal nerve stimulator, which would involve a referral for consultation with an ENT surgeon, after careful selection, following inclusion criteria - also not first-line treatment). I explained the PAP treatment option to the patient in detail, as this is generally considered first-line treatment.  The patient indicated that she would be willing to try PAP therapy, if the need arises. I explained the importance of being compliant with PAP treatment, not  only for insurance purposes but primarily to improve patient's symptoms symptoms, and for the patient's long term health benefit, including to reduce Her cardiovascular risks longer-term.    We will pick up our discussion about the next steps and treatment options after testing.  We will keep her posted as to the test results by phone call and/or MyChart messaging where possible.  We will plan to follow-up in sleep clinic accordingly as well.  I answered all her questions today and the patient was in agreement.   I encouraged her to call with any interim questions, concerns, problems or updates or email [name] through MyChart.  Generally speaking, sleep test authorizations may take up to 2 weeks, sometimes less, sometimes longer, the patient is encouraged to get in touch with [name] if they do not hear back from the sleep lab staff directly within the next 2 weeks.  Thank you very much for allowing me to participate in the care of this nice patient. If I can be of any further assistance to you please do not hesitate to call me at [phone].  Sincerely,   Huston Foley, MD, PhD

## 2022-11-30 ENCOUNTER — Telehealth: Payer: Self-pay | Admitting: Neurology

## 2022-11-30 NOTE — Telephone Encounter (Signed)
Patient returned Regina Jennings call and left me a voicemail.  I called her back but it went to her voicemail I left her another voicemail to call back to schedule.

## 2022-11-30 NOTE — Telephone Encounter (Signed)
11/30/22 LVM [place] 11/24/22 [employer] state no auth req EE

## 2022-12-02 ENCOUNTER — Ambulatory Visit (INDEPENDENT_AMBULATORY_CARE_PROVIDER_SITE_OTHER): Payer: BC Managed Care – PPO | Admitting: Internal Medicine

## 2022-12-02 ENCOUNTER — Encounter: Payer: Self-pay | Admitting: Internal Medicine

## 2022-12-02 VITALS — BP 122/84 | HR 74 | Temp 98.2°F | Ht 63.0 in | Wt 198.8 lb

## 2022-12-02 DIAGNOSIS — Z0000 Encounter for general adult medical examination without abnormal findings: Secondary | ICD-10-CM | POA: Diagnosis not present

## 2022-12-02 DIAGNOSIS — Z6835 Body mass index (BMI) 35.0-35.9, adult: Secondary | ICD-10-CM

## 2022-12-02 DIAGNOSIS — I10 Essential (primary) hypertension: Secondary | ICD-10-CM | POA: Diagnosis not present

## 2022-12-02 DIAGNOSIS — M79622 Pain in left upper arm: Secondary | ICD-10-CM | POA: Diagnosis not present

## 2022-12-02 DIAGNOSIS — L918 Other hypertrophic disorders of the skin: Secondary | ICD-10-CM

## 2022-12-02 LAB — POCT URINALYSIS DIPSTICK
Bilirubin, UA: NEGATIVE
Glucose, UA: NEGATIVE
Ketones, UA: NEGATIVE
Leukocytes, UA: NEGATIVE
Nitrite, UA: NEGATIVE
Protein, UA: NEGATIVE
Spec Grav, UA: 1.025 (ref 1.010–1.025)
Urobilinogen, UA: 0.2 E.U./dL
pH, UA: 6.5 (ref 5.0–8.0)

## 2022-12-02 LAB — CMP14+EGFR
ALT: 13 IU/L (ref 0–32)
AST: 17 IU/L (ref 0–40)
Albumin/Globulin Ratio: 1.5 (ref 1.2–2.2)
Albumin: 4.4 g/dL (ref 3.9–4.9)
Alkaline Phosphatase: 63 IU/L (ref 44–121)
BUN/Creatinine Ratio: 13 (ref 9–23)
BUN: 14 mg/dL (ref 6–24)
Bilirubin Total: 0.4 mg/dL (ref 0.0–1.2)
CO2: 24 mmol/L (ref 20–29)
Calcium: 9.7 mg/dL (ref 8.7–10.2)
Chloride: 100 mmol/L (ref 96–106)
Creatinine, Ser: 1.04 mg/dL — ABNORMAL HIGH (ref 0.57–1.00)
Globulin, Total: 2.9 g/dL (ref 1.5–4.5)
Glucose: 83 mg/dL (ref 70–99)
Potassium: 4.1 mmol/L (ref 3.5–5.2)
Sodium: 138 mmol/L (ref 134–144)
Total Protein: 7.3 g/dL (ref 6.0–8.5)
eGFR: 67 mL/min/{1.73_m2} (ref >59)

## 2022-12-02 LAB — HEMOGLOBIN A1C
Est. average glucose Bld gHb Est-mCnc: 123 mg/dL
Hgb A1c MFr Bld: 5.9 % — ABNORMAL HIGH (ref 4.8–5.6)

## 2022-12-02 LAB — CBC
Hematocrit: 40 % (ref 34.0–46.6)
Hemoglobin: 12.8 g/dL (ref 11.1–15.9)
MCH: 25.3 pg — ABNORMAL LOW (ref 26.6–33.0)
MCHC: 32 g/dL (ref 31.5–35.7)
MCV: 79 fL (ref 79–97)
Platelets: 272 10*3/uL (ref 150–450)
RBC: 5.06 x10E6/uL (ref 3.77–5.28)
RDW: 13.6 % (ref 11.7–15.4)
WBC: 6.3 10*3/uL (ref 3.4–10.8)

## 2022-12-02 LAB — LIPID PANEL
Chol/HDL Ratio: 2.5 ratio (ref 0.0–4.4)
Cholesterol, Total: 197 mg/dL (ref 100–199)
HDL: 78 mg/dL (ref >39)
LDL Chol Calc (NIH): 109 mg/dL — ABNORMAL HIGH (ref 0–99)
Triglycerides: 56 mg/dL (ref 0–149)
VLDL Cholesterol Cal: 10 mg/dL (ref 5–40)

## 2022-12-02 NOTE — Progress Notes (Addendum)
I,Victoria T Hamilton,acting as a scribe for Gwynneth Aliment, MD.,have documented all relevant documentation on the behalf of Gwynneth Aliment, MD,as directed by  Gwynneth Aliment, MD while in the presence of Gwynneth Aliment, MD.   Subjective:     Patient ID: Regina Jennings , female    DOB: July 13, 1975 , 47 y.o.   MRN: [id number]   Chief Complaint  Patient presents with   Annual Exam   Hypertension    HPI  She is here today for a full physical examination.  She is followed by Henreitta Leber, PA for her GYN exams.  She was seen for her pelvic exam earlier this year. She also receives her mammograms at [facility] as well.   She adds, having pain on the left side of her upper arm. She admits the discomfort has worsened. She states she does sleep on her left side.    Hypertension This is a chronic problem. The current episode started more than 1 year ago. The problem has been gradually improving since onset. The problem is controlled. Pertinent negatives include no blurred vision, palpitations or shortness of breath. Risk factors for coronary artery disease include sedentary lifestyle. Past treatments include angiotensin blockers and diuretics. The current treatment provides moderate improvement. Compliance problems include exercise.      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   Hypertension    Vitamin D deficiency      Family History  Problem Relation Age of Onset   Migraines Mother    Ulcers Mother    Hypertension Mother    Hyperlipidemia Mother    Irritable bowel syndrome Mother    GER disease Mother    Breast cancer Mother        13's   Hypertension Maternal Grandmother    Stroke Maternal Grandmother    Congestive Heart Failure Maternal Grandmother    Breast cancer Paternal Grandmother    Sleep apnea Neg Hx      Current Outpatient Medications:    Cholecalciferol (VITAMIN D) 50 MCG (2000 UT) CAPS, Take 2,000 Units by  mouth See admin instructions. Takes 2,000 units every day, Disp: , Rfl:    losartan-hydrochlorothiazide (HYZAAR) 50-12.5 MG tablet, TAKE 1 TABLET BY MOUTH DAILY, Disp: 90 tablet, Rfl: 2   MAGNESIUM GLYCINATE PO, Take by mouth as needed., Disp: , Rfl:    buPROPion (WELLBUTRIN XL) 150 MG 24 hr tablet, Take 1 tablet (150 mg total) by mouth every morning. (Patient not taking: Reported on 12/02/2022), Disp: 30 tablet, Rfl: 2   diclofenac Sodium (VOLTAREN) 1 % GEL, Apply 4 g topically 4 (four) times daily. (Patient not taking: Reported on 07/27/2022), Disp: 150 g, Rfl: 2   Fluocinolone Acetonide Body (DERMA-SMOOTHE/FS BODY) 0.01 % OIL, Apply to scalp daily prn (Patient not taking: Reported on 07/27/2022), Disp: 118 mL, Rfl: 0   norethindrone (ORTHO MICRONOR) 0.35 MG tablet, Take 1 tablet (0.35 mg total) by mouth daily., Disp: 28 tablet, Rfl: 11   nystatin cream (MYCOSTATIN), Apply 1 application topically 2 (two) times daily. (Patient not taking: Reported on 07/27/2022), Disp: 30 g, Rfl: 0   OPZELURA 1.5 % CREA, Apply 1 application topically 2 (two) times daily. (Patient not taking: Reported on 07/27/2022), Disp: , Rfl:    pimecrolimus (ELIDEL) 1 % cream, SMARTSIG:Sparingly Topical Twice Daily (Patient not taking: Reported on 07/27/2022), Disp: , Rfl:    triamcinolone cream (KENALOG) 0.1 %, Apply topically  2 (two) times daily as needed. (Patient not taking: Reported on 07/27/2022), Disp: , Rfl:    Allergies  Allergen Reactions   Boric Acid Hives   Flagyl [Metronidazole]    Sulfa Antibiotics Rash   Xyzal [Cetirizine Hcl] Rash      The patient states she uses none for birth control. Last LMP was Patient's last menstrual period was 08/05/2022 (exact date).. Negative for Dysmenorrhea. Negative for: breast discharge, breast lump(s), breast pain and breast self exam. Associated symptoms include abnormal vaginal bleeding. Pertinent negatives include abnormal bleeding (hematology), anxiety, decreased libido,  depression, difficulty falling sleep, dyspareunia, history of infertility, nocturia, sexual dysfunction, sleep disturbances, urinary incontinence, urinary urgency, vaginal discharge and vaginal itching. Diet regular.The patient states her exercise level is    . The patient's tobacco use is:  Social History   Tobacco Use  Smoking Status Never  Smokeless Tobacco Never  . She has been exposed to passive smoke. The patient's alcohol use is:  Social History   Substance and Sexual Activity  Alcohol Use Yes   Comment: seldom, maybe twice a year   Review of Systems  Constitutional: Negative.   HENT: Negative.    Eyes: Negative.  Negative for blurred vision.  Respiratory: Negative.  Negative for shortness of breath.   Cardiovascular: Negative.  Negative for palpitations.  Gastrointestinal: Negative.   Endocrine: Negative.   Genitourinary: Negative.   Musculoskeletal:  Positive for arthralgias.       She c/o left arm pain. Denies fall/trauma. States she sleeps on her left side, this is not a new habit. It does awaken her from sleep on occasion. Described as dull pain, tender to touch. Sx first started about 2-3 months ago. There has been no relief in her symptoms. She has tried Tylenol without relief of her symptoms. She is right hand dominant.   Skin: Negative.   Allergic/Immunologic: Negative.   Neurological: Negative.   Hematological: Negative.   Psychiatric/Behavioral: Negative.       Today's Vitals   12/02/22 0824 12/02/22 0919  BP: 132/84 122/84  Pulse: 74   Temp: 98.2 F (36.8 C)   SpO2: 98%   Weight: 198 lb 12.8 oz (90.2 kg)   Height: 5\' 3"  (1.6 m)    Body mass index is 35.22 kg/m.  Wt Readings from Last 3 Encounters:  12/02/22 198 lb 12.8 oz (90.2 kg)  11/23/22 199 lb (90.3 kg)  07/27/22 203 lb (92.1 kg)   The 10-year ASCVD risk score (Arnett DK, et al., 2019) is: 1.7%   Values used to calculate the score:     Age: 30 years     Sex: Female     Is Non-Hispanic  African American: Yes     Diabetic: No     Tobacco smoker: No     Systolic Blood Pressure: 122 mmHg     Is BP treated: Yes     HDL Cholesterol: 61 mg/dL     Total Cholesterol: 203 mg/dL    Objective:  Physical Exam Vitals and nursing note reviewed.  Constitutional:      Appearance: Normal appearance.  HENT:     Head: Normocephalic and atraumatic.     Right Ear: Tympanic membrane, ear canal and external ear normal.     Left Ear: Tympanic membrane, ear canal and external ear normal.     Nose: Nose normal.     Mouth/Throat:     Mouth: Mucous membranes are moist.     Pharynx: Oropharynx is clear.  Eyes:     Extraocular Movements: Extraocular movements intact.     Conjunctiva/sclera: Conjunctivae normal.     Pupils: Pupils are equal, round, and reactive to light.  Cardiovascular:     Rate and Rhythm: Normal rate and regular rhythm.     Pulses: Normal pulses.     Heart sounds: Normal heart sounds.  Pulmonary:     Effort: Pulmonary effort is normal.     Breath sounds: Normal breath sounds.  Chest:  Breasts:    Tanner Score is 5.     Right: Normal.     Left: Normal.  Abdominal:     General: Bowel sounds are normal.     Palpations: Abdomen is soft.  Genitourinary:    Comments: deferred Musculoskeletal:        General: Tenderness present. Normal range of motion.     Cervical back: Normal range of motion and neck supple.     Comments: Tenderness left upr arm to deep palpation, no overlying erythema  Skin:    General: Skin is warm and dry.     Comments: Skin tag left upr arm and right base of neck  Neurological:     General: No focal deficit present.     Mental Status: She is alert and oriented to person, place, and time.  Psychiatric:        Mood and Affect: Mood normal.        Behavior: Behavior normal.       Assessment And Plan:     1. Routine general medical examination at health care facility Comments: A full exam was performed. Importance of monthly self breast  exams was discussed with the patient.  PATIENT IS ADVISED TO GET 30-45 MINUTES REGULAR EXERCISE NO LESS THAN FOUR TO FIVE DAYS PER WEEK - BOTH WEIGHTBEARING EXERCISES AND AEROBIC ARE RECOMMENDED.  PATIENT IS ADVISED TO FOLLOW A HEALTHY DIET WITH AT LEAST SIX FRUITS/VEGGIES PER DAY, DECREASE INTAKE OF RED MEAT, AND TO INCREASE FISH INTAKE TO TWO DAYS PER WEEK.  MEATS/FISH SHOULD NOT BE FRIED, BAKED OR BROILED IS PREFERABLE.  IT IS ALSO IMPORTANT TO CUT BACK ON YOUR SUGAR INTAKE. PLEASE AVOID ANYTHING WITH ADDED SUGAR, CORN SYRUP OR OTHER SWEETENERS. IF YOU MUST USE A SWEETENER, YOU CAN TRY STEVIA. IT IS ALSO IMPORTANT TO AVOID ARTIFICIALLY SWEETENERS AND DIET BEVERAGES. LASTLY, I SUGGEST WEARING SPF 50 SUNSCREEN ON EXPOSED PARTS AND ESPECIALLY WHEN IN THE DIRECT SUNLIGHT FOR AN EXTENDED PERIOD OF TIME.  PLEASE AVOID FAST FOOD RESTAURANTS AND INCREASE YOUR WATER INTAKE.  2. Essential hypertension, benign Comments: Chronic, fair control. EKG performed, SB w/o acute changes. She will c/w losartan/hct for now. Advised to incorporate more exercise into her normal routine. She will rto in six months for re-evaluation.  - POCT Urinalysis Dipstick (81002) - Microalbumin / Creatinine Urine Ratio - EKG 12-Lead  3. Skin tag Comments: Unfortunately, her dermatologist has left the practice. I will refer her to new provider for excision as requested.  4. Left upper arm pain Comments: Likely due to muscle strain. She agrees to try topical Voltaren gel to affected area twice daily as needed. She was also given sample of lidocaine patch to try.  5. Class 2 severe obesity due to excess calories with serious comorbidity and body mass index (BMI) of 35.0 to 35.9 in adult [facility]) Comments: She is encouraged to aim for at least 150 minutes of exercise/week, while striving for BMI<30 to decrease cardiac risk. She agrees to referral to [facility]  clinic.   Return for 1 year physical, 6 month bp. Patient was given opportunity to ask  questions. Patient verbalized understanding of the plan and was able to repeat key elements of the plan. All questions were answered to their satisfaction.   I, Gwynneth Aliment, MD, have reviewed all documentation for this visit. The documentation on 12/02/22 for the exam, diagnosis, procedures, and orders are all accurate and complete.   THE PATIENT IS ENCOURAGED TO PRACTICE SOCIAL DISTANCING DUE TO THE COVID-19 PANDEMIC.

## 2022-12-02 NOTE — Patient Instructions (Signed)

## 2022-12-03 LAB — MICROALBUMIN / CREATININE URINE RATIO
Creatinine, Urine: 37.2 mg/dL
Microalb/Creat Ratio: <8 mg/g creat (ref 0–29)
Microalbumin, Urine: <3 ug/mL

## 2022-12-08 ENCOUNTER — Encounter (INDEPENDENT_AMBULATORY_CARE_PROVIDER_SITE_OTHER): Payer: BC Managed Care – PPO | Admitting: Family Medicine

## 2022-12-08 DIAGNOSIS — Z0289 Encounter for other administrative examinations: Secondary | ICD-10-CM

## 2022-12-20 NOTE — Telephone Encounter (Signed)
I spoke with the patient.  NPSG- BCBS state no auth req   She is scheduled at [facility] for 01/23/23 at 8 pm.  Mailed packet to the patient.

## 2023-01-18 ENCOUNTER — Encounter (INDEPENDENT_AMBULATORY_CARE_PROVIDER_SITE_OTHER): Payer: Self-pay | Admitting: Family Medicine

## 2023-01-18 ENCOUNTER — Ambulatory Visit (INDEPENDENT_AMBULATORY_CARE_PROVIDER_SITE_OTHER): Payer: BC Managed Care – PPO | Admitting: Family Medicine

## 2023-01-18 VITALS — BP 126/81 | HR 106 | Temp 98.5°F | Ht 63.0 in | Wt 192.0 lb

## 2023-01-18 DIAGNOSIS — Z1331 Encounter for screening for depression: Secondary | ICD-10-CM

## 2023-01-18 DIAGNOSIS — R7303 Prediabetes: Secondary | ICD-10-CM

## 2023-01-18 DIAGNOSIS — R5383 Other fatigue: Secondary | ICD-10-CM | POA: Diagnosis not present

## 2023-01-18 DIAGNOSIS — I10 Essential (primary) hypertension: Secondary | ICD-10-CM | POA: Diagnosis not present

## 2023-01-18 DIAGNOSIS — R0602 Shortness of breath: Secondary | ICD-10-CM | POA: Diagnosis not present

## 2023-01-18 DIAGNOSIS — E669 Obesity, unspecified: Secondary | ICD-10-CM

## 2023-01-18 DIAGNOSIS — E782 Mixed hyperlipidemia: Secondary | ICD-10-CM | POA: Diagnosis not present

## 2023-01-18 DIAGNOSIS — D509 Iron deficiency anemia, unspecified: Secondary | ICD-10-CM

## 2023-01-18 DIAGNOSIS — Z6834 Body mass index (BMI) 34.0-34.9, adult: Secondary | ICD-10-CM

## 2023-01-18 NOTE — Progress Notes (Signed)
Chief Complaint:   OBESITY Regina Jennings (MR# [id number]) is a 47 y.o. female who presents for evaluation and treatment of obesity and related comorbidities. Current BMI is Body mass index is 34.01 kg/m. Regina Jennings has been struggling with her weight for many years and has been unsuccessful in either losing weight, maintaining weight loss, or reaching her healthy weight goal.  Regina Jennings is currently in the action stage of change and ready to dedicate time achieving and maintaining a healthier weight. Regina Jennings is interested in becoming our patient and working on intensive lifestyle modifications including (but not limited to) diet and exercise for weight loss.  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  Works 16 hours remotely and 24 hours outside the home.  Lives at home with daughter Regina Jennings.  Her daughter is supportive of her but they don't eat meals together. Does not anticipate any sabotage. Desired weight is 160-175lb but she cannot recall when she was that weight. Previously did Weight Watchers and liked in person support and accountability.  She has also worked with nutritionists before. Time challenge to cooking but she is trying to cook more at home. She does like leftovers.   Food Recall: 20oz bottle of water in the am. 2.5 hours later eats breakfast- can eat anything like leftovers or eggs.  Example would be 1 hardboiled egg and 3 chicken sausage and 4-6oz of juice or diet tea. Feels satisfied from that.  Next time she eats will be a few hours later.  Typical lunch would be piece of bread with Regina Jennings and cheese and mayo and chips.  2 thin slices of Regina Jennings on each side and 1 slice of cheese on each side. 0.5 tsp of mayo on each piece of bread. Took handful of chips out of a big bag.  Felt satisfied.  Ate again later last night.  Air friend chicken drummettes (5), spinach and onions (1 cup) and rice (2/3 cup).  Felt satisfied from that dinner.  Didn't eat after dinner  but typically she does and has skinny pop or chips.   Regina Jennings's habits were reviewed today and are as follows: her desired weight loss is 17-32 lbs, she started gaining weight within the years following giving birth, her heaviest weight ever was 205 pounds, she has significant food cravings issues, she snacks frequently in the evenings, she is frequently drinking liquids with calories, she frequently makes poor food choices, she frequently eats larger portions than normal, and she struggles with emotional eating.  Depression Screen Regina Jennings's Food and Mood (modified PHQ-9) score was 8.  Subjective:   1. Other fatigue Regina Jennings admits to daytime somnolence and admits to waking up still tired. Patient has a history of symptoms of daytime fatigue and morning fatigue. Regina Jennings generally gets 5 1/2 or 6 hours of sleep per night, and states that she has nightime awakenings. Snoring is present. Apneic episodes are present. Epworth Sleepiness Score is 14.  EKG done on 12/02/2022-sinus bradycardia.  Has sleep study scheduled that she needs to reschedule.  2. SOBOE (shortness of breath on exertion) Regina Jennings notes increasing shortness of breath with exercising and seems to be worsening over time with weight gain. She notes getting out of breath sooner with activity than she used to. This has not gotten worse recently. Regina Jennings denies shortness of breath at rest or orthopnea.  3. Prediabetes Patient's recent A1c was 5.9, first elevated A1c and for years.  Patient is not on medications.  4.  Primary hypertension Patient is on losartan-hydrochlorothiazide (been on this for length of diagnosis).  Patient was diagnosed > 10 years ago.  Patient denies chest pain, chest pressure, or headache.  5. Mixed hyperlipidemia Patient's last LDL improved from prior labs, but still slightly elevated.  She is not on medications.  6. Microcytic anemia Patient has a historically low MCV.  Low hemoglobin and hematocrit  previously.  Assessment/Plan:   1. Other fatigue Regina Jennings does feel that her weight is causing her energy to be lower than it should be. Fatigue may be related to obesity, depression or many other causes. Labs will be ordered, and in the meanwhile, Regina Jennings will focus on self care including making healthy food choices, increasing physical activity and focusing on stress reduction.  - VITAMIN D 25 Hydroxy (Vit-D Deficiency, Fractures) - TSH - T4, free - T3  2. SOBOE (shortness of breath on exertion) Regina Jennings does feel that she gets out of breath more easily that she used to when she exercises. Regina Jennings's shortness of breath appears to be obesity related and exercise induced. She has agreed to work on weight loss and gradually increase exercise to treat her exercise induced shortness of breath. Will continue to monitor closely.  3. Prediabetes We will check labs today, and we will follow-up at patient's next appointment.  - Insulin, random  4. Primary hypertension We will follow-up on patient's blood pressure at her next appointment.  5. Mixed hyperlipidemia We will repeat labs in October.  6. Microcytic anemia We will check labs today, and we will follow-up at patient's next appointment.  - Anemia panel  7. Depression screening Regina Jennings had a positive depression screening. Depression is commonly associated with obesity and often results in emotional eating behaviors. We will monitor this closely and work on CBT to help improve the non-hunger eating patterns. Referral to Psychology may be required if no improvement is seen as she continues in our clinic.  8. Class 1 obesity with serious comorbidity and body mass index (BMI) of 34.0 to 34.9 in adult, unspecified obesity type Regina Jennings is currently in the action stage of change and her goal is to continue with weight loss efforts. I recommend Regina Jennings begin the structured treatment plan as follows:  She has agreed to the Category 2  Plan.  Exercise goals: No exercise has been prescribed at this time.   Behavioral modification strategies: increasing lean protein intake, meal planning and cooking strategies, keeping healthy foods in the home, and planning for success.  She was informed of the importance of frequent follow-up visits to maximize her success with intensive lifestyle modifications for her multiple health conditions. She was informed we would discuss her lab results at her next visit unless there is a critical issue that needs to be addressed sooner. Regina Jennings agreed to keep her next visit at the agreed upon time to discuss these results.  Objective:   Blood pressure 126/81, pulse (!) 106, temperature 98.5 F (36.9 C), height 5\' 3"  (1.6 m), weight 192 lb (87.1 kg), last menstrual period 11/02/2022, SpO2 97%. Body mass index is 34.01 kg/m.  EKG: Normal sinus rhythm, rate (unable to obtain).  Indirect Calorimeter completed today shows a VO2 of 196 and a REE of 1354.  Her calculated basal metabolic rate is 4098 thus her basal metabolic rate is worse than expected.  General: Cooperative, alert, well developed, in no acute distress. HEENT: Conjunctivae and lids unremarkable. Cardiovascular: Regular rhythm.  Lungs: Normal work of breathing. Neurologic: No focal deficits.  Lab Results  Component Value Date   CREATININE 1.04 (H) 12/02/2022   BUN 14 12/02/2022   NA 138 12/02/2022   K 4.1 12/02/2022   CL 100 12/02/2022   CO2 24 12/02/2022   Lab Results  Component Value Date   ALT 13 12/02/2022   AST 17 12/02/2022   ALKPHOS 63 12/02/2022   BILITOT 0.4 12/02/2022   Lab Results  Component Value Date   HGBA1C 5.9 (H) 12/02/2022   HGBA1C 5.9 (H) 07/27/2022   HGBA1C 5.6 10/05/2021   HGBA1C 5.8 (H) 04/06/2021   HGBA1C 5.6 03/27/2019   Lab Results  Component Value Date   INSULIN 10.9 10/05/2021   Lab Results  Component Value Date   TSH 1.430 07/27/2022   Lab Results  Component Value Date   CHOL  197 12/02/2022   HDL 78 12/02/2022   LDLCALC 109 (H) 12/02/2022   TRIG 56 12/02/2022   CHOLHDL 2.5 12/02/2022   Lab Results  Component Value Date   WBC 6.3 12/02/2022   HGB 12.8 12/02/2022   HCT 40.0 12/02/2022   MCV 79 12/02/2022   PLT 272 12/02/2022   Lab Results  Component Value Date   IRON 55 07/27/2022   TIBC 363 07/27/2022   FERRITIN 92 07/27/2022   Attestation Statements:   Reviewed by clinician on day of visit: allergies, medications, problem list, medical history, surgical history, family history, social history, and previous encounter notes.  Time spent on visit including pre-visit chart review and post-visit charting and care was 45 minutes.   I, Burt Knack, am acting as transcriptionist for Reuben Likes, MD. This is the patient's first visit at [facility]. The patient's NEW PATIENT PACKET was reviewed at length. Included in the packet: current and past health history, medications, allergies, ROS, gynecologic history (women only), surgical history, family history, social history, weight history, weight loss surgery history (for those that have had weight loss surgery), nutritional evaluation, mood and food questionnaire, PHQ9, Epworth questionnaire, sleep habits questionnaire, patient life and health improvement goals questionnaire. These will all be scanned into the patient's chart under media.   During the visit, I independently reviewed the patient's EKG, bioimpedance scale results, and indirect calorimeter results. I used this information to tailor a meal plan for the patient that will help her to lose weight and will improve her obesity-related conditions going forward. I performed a medically necessary appropriate examination and/or evaluation. I discussed the assessment and treatment plan with the patient. The patient was provided an opportunity to ask questions and all were answered. The patient agreed with the plan and demonstrated an  understanding of the instructions. Labs were ordered at this visit and will be reviewed at the next visit unless more critical results need to be addressed immediately. Clinical information was updated and documented in the EMR.    I have reviewed the above documentation for accuracy and completeness, and I agree with the above. - Reuben Likes, MD

## 2023-01-20 LAB — ANEMIA PANEL
Ferritin: 72 ng/mL (ref 15–150)
Folate, Hemolysate: 323 ng/mL
Folate, RBC: 837 ng/mL (ref >498)
Hematocrit: 38.6 % (ref 34.0–46.6)
Iron Saturation: 18 % (ref 15–55)
Iron: 69 ug/dL (ref 27–159)
Retic Ct Pct: 1.6 % (ref 0.6–2.6)
Total Iron Binding Capacity: 381 ug/dL (ref 250–450)
UIBC: 312 ug/dL (ref 131–425)
Vitamin B-12: 314 pg/mL (ref 232–1245)

## 2023-01-20 LAB — TSH: TSH: 2.85 u[IU]/mL (ref 0.450–4.500)

## 2023-01-20 LAB — T3: T3, Total: 140 ng/dL (ref 71–180)

## 2023-01-20 LAB — VITAMIN D 25 HYDROXY (VIT D DEFICIENCY, FRACTURES): Vit D, 25-Hydroxy: 53 ng/mL (ref 30.0–100.0)

## 2023-01-20 LAB — T4, FREE: Free T4: 1.21 ng/dL (ref 0.82–1.77)

## 2023-01-20 LAB — INSULIN, RANDOM: INSULIN: 9.5 u[IU]/mL (ref 2.6–24.9)

## 2023-02-02 IMAGING — US US THYROID
1 series · 14 of 25 positions shown · non-contrast
Comparison: None.

CLINICAL DATA: Neck pain

EXAM:
THYROID ULTRASOUND
TECHNIQUE: Ultrasound examination of the thyroid gland and adjacent soft
tissues was performed.

[Series 1: us thyroid · 0.06mm/px · 14 of 59 slices shown]
[im 1/59]
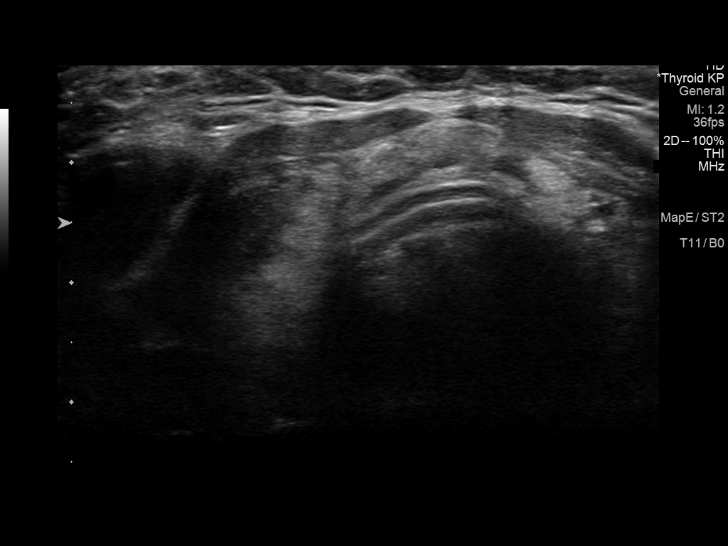
[im 5/59]
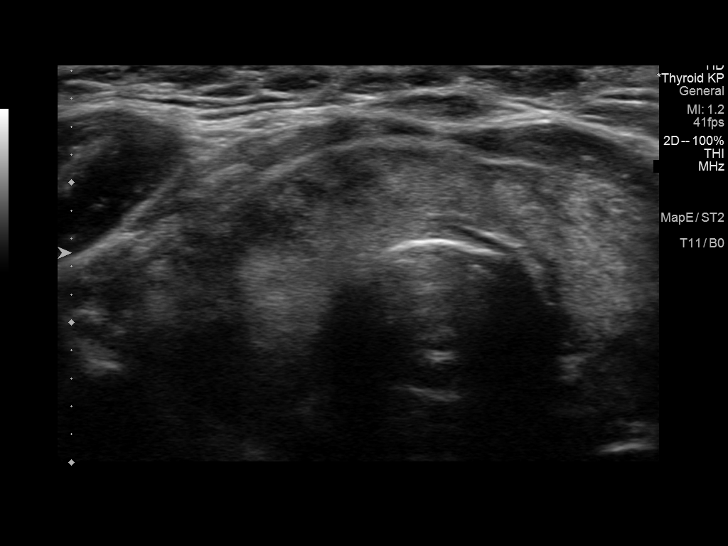
[im 10/59]
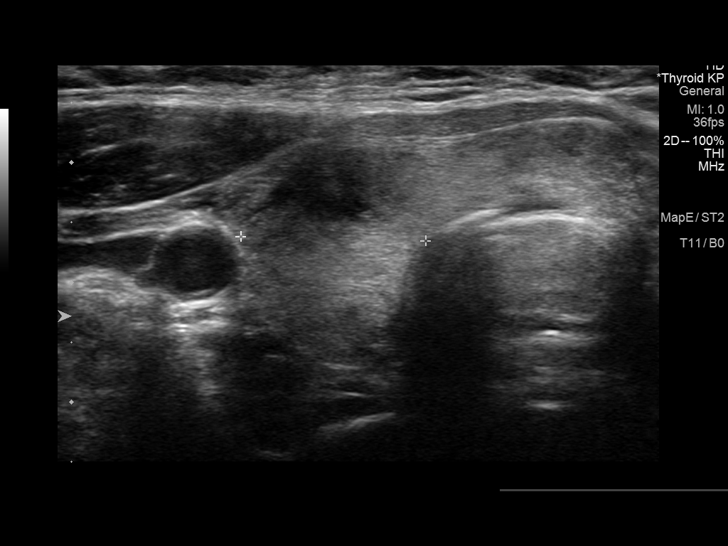
[im 15/59]
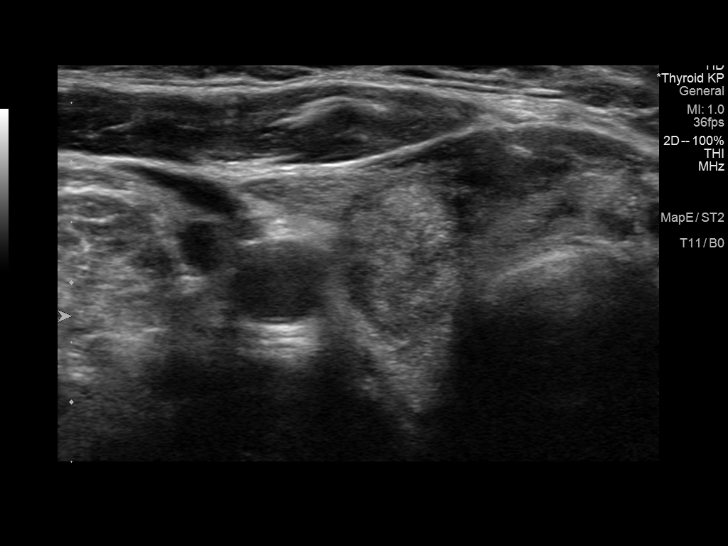
[im 20/59]
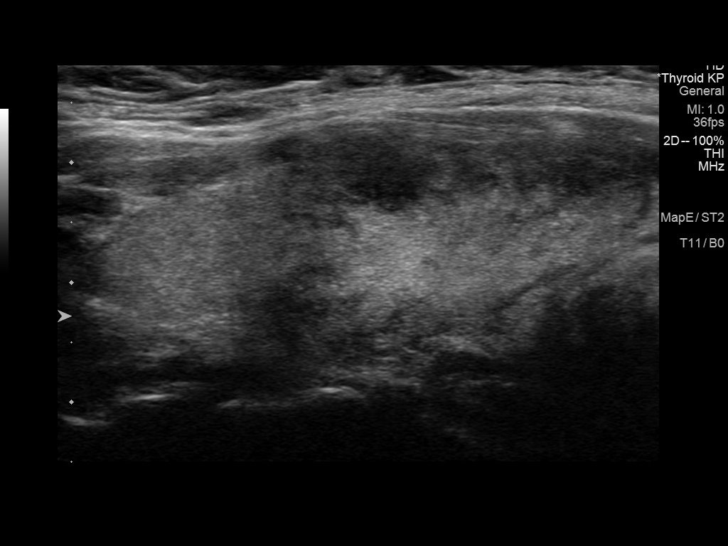
[im 22/59]
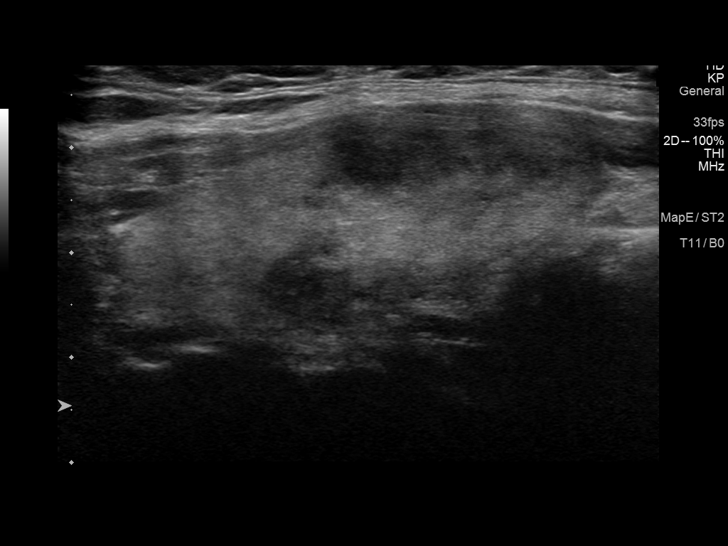
[im 27/59]
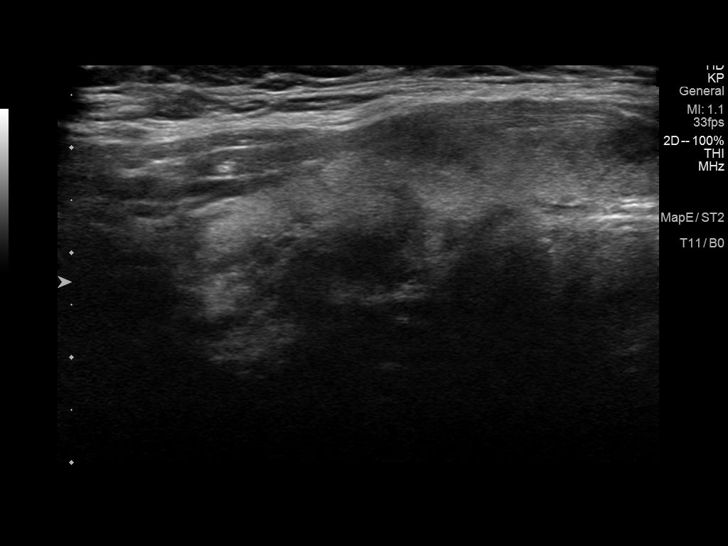
[im 32/59]
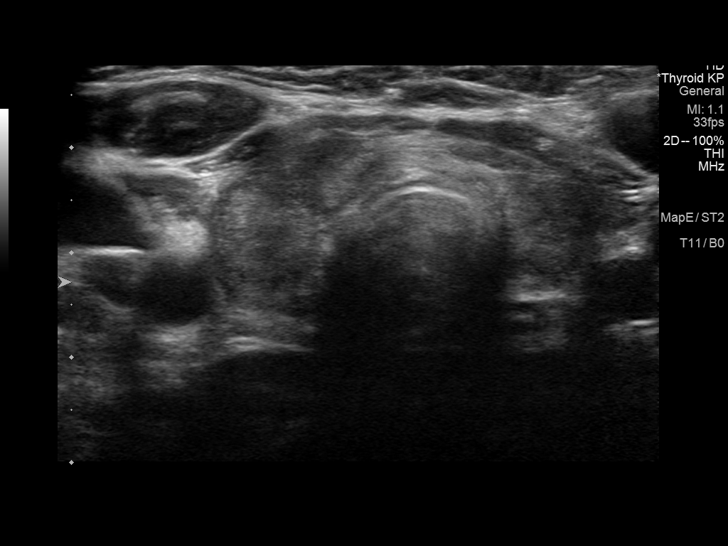
[im 37/59]
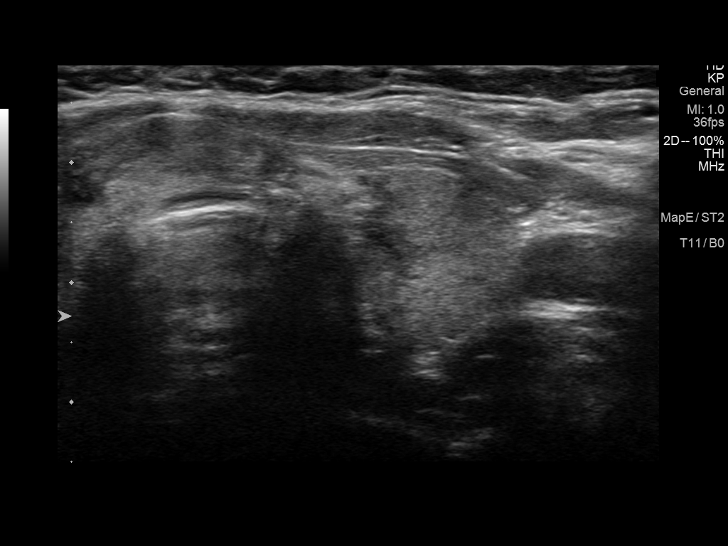
[im 39/59]
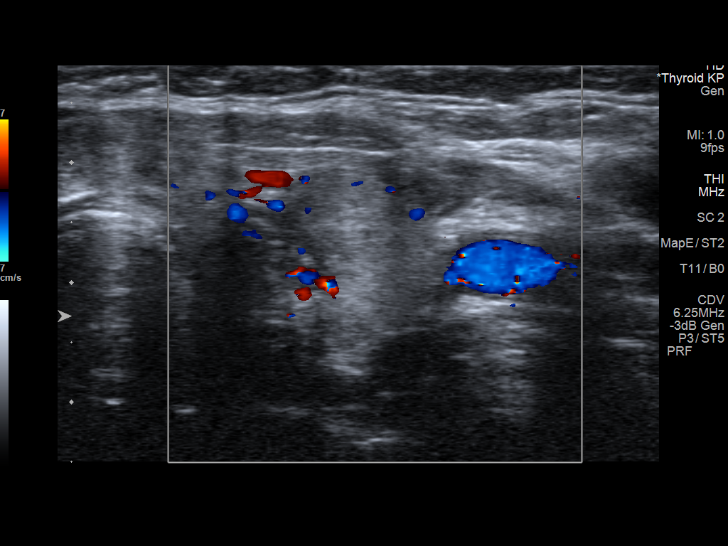
[im 44/59]
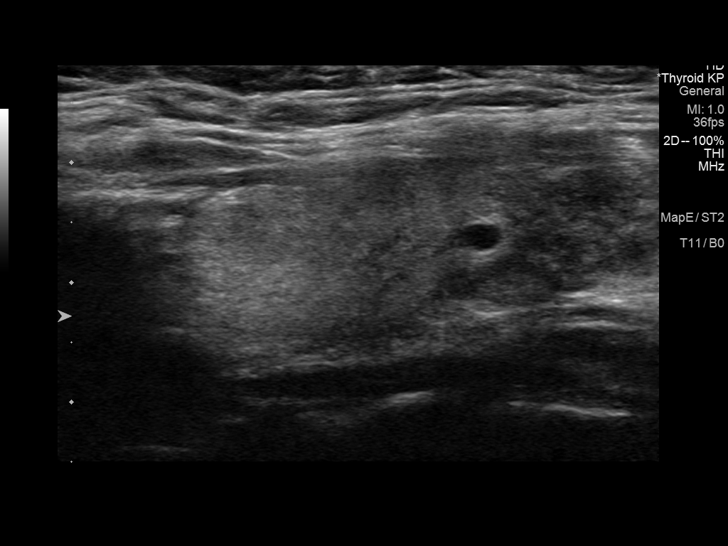
[im 49/59]
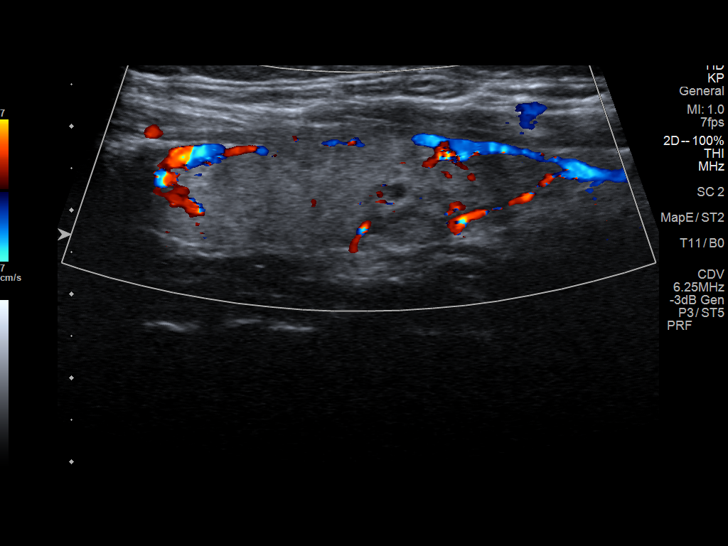
[im 54/59]
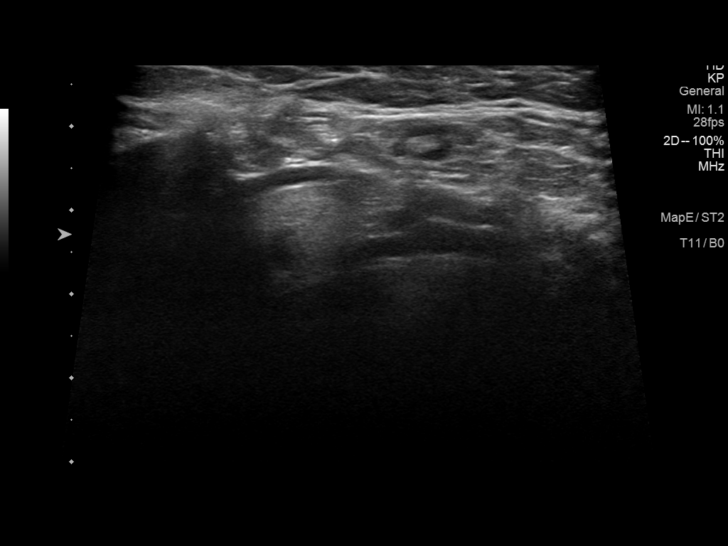
[im 59/59]
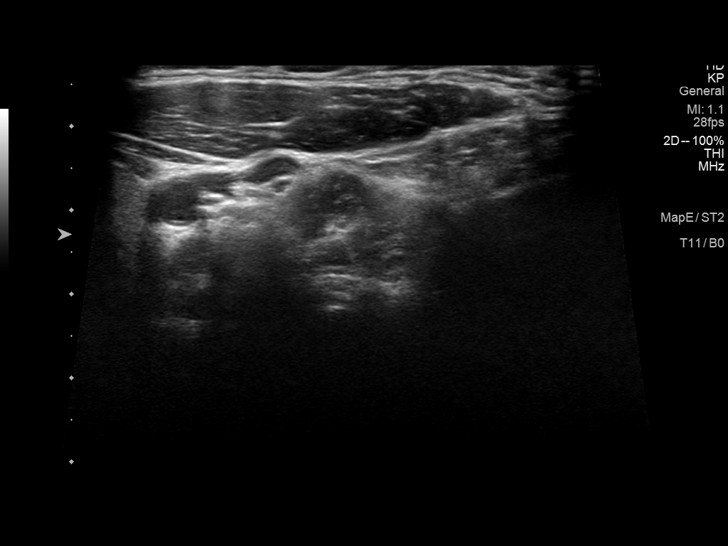

[14 of 25 positions shown; findings below may reference images not displayed]

FINDINGS: Parenchymal Echotexture: Moderate anterior 80

Isthmus: 0.7 cm

Right lobe: 4.9 x 1.8 x 1.5 cm

Left lobe: 5.3 x 1.4 x 1.5 cm

_________________________________________________________

Estimated total number of nodules >/= 1 cm: 0

Number of spongiform nodules >/=  2 cm not described below (TR1): 0

Number of mixed cystic and solid nodules >/= 1.5 cm not described
below (TR2): 0

_________________________________________________________

No discrete nodules are seen within the thyroid gland.
IMPRESSION: Moderate diffuse heterogeneity of the thyroid parenchyma without
discrete nodule.

The above is in keeping with the ACR TI-RADS recommendations - [HOSPITAL] 9692;[DATE].

## 2023-02-03 ENCOUNTER — Telehealth (INDEPENDENT_AMBULATORY_CARE_PROVIDER_SITE_OTHER): Payer: Self-pay | Admitting: Family Medicine

## 2023-02-03 ENCOUNTER — Ambulatory Visit (INDEPENDENT_AMBULATORY_CARE_PROVIDER_SITE_OTHER): Payer: BC Managed Care – PPO | Admitting: Family Medicine

## 2023-02-03 ENCOUNTER — Encounter (INDEPENDENT_AMBULATORY_CARE_PROVIDER_SITE_OTHER): Payer: Self-pay | Admitting: Family Medicine

## 2023-02-03 ENCOUNTER — Ambulatory Visit (INDEPENDENT_AMBULATORY_CARE_PROVIDER_SITE_OTHER): Payer: BC Managed Care – PPO | Admitting: Internal Medicine

## 2023-02-03 ENCOUNTER — Encounter: Payer: Self-pay | Admitting: Internal Medicine

## 2023-02-03 VITALS — BP 123/83 | HR 88 | Temp 98.9°F | Ht 63.0 in | Wt 191.0 lb

## 2023-02-03 VITALS — BP 118/82 | HR 78 | Temp 98.3°F | Ht 63.0 in | Wt 193.6 lb

## 2023-02-03 DIAGNOSIS — E7849 Other hyperlipidemia: Secondary | ICD-10-CM | POA: Diagnosis not present

## 2023-02-03 DIAGNOSIS — E559 Vitamin D deficiency, unspecified: Secondary | ICD-10-CM

## 2023-02-03 DIAGNOSIS — Z6834 Body mass index (BMI) 34.0-34.9, adult: Secondary | ICD-10-CM

## 2023-02-03 DIAGNOSIS — R7303 Prediabetes: Secondary | ICD-10-CM

## 2023-02-03 DIAGNOSIS — R051 Acute cough: Secondary | ICD-10-CM

## 2023-02-03 DIAGNOSIS — E669 Obesity, unspecified: Secondary | ICD-10-CM

## 2023-02-03 DIAGNOSIS — I10 Essential (primary) hypertension: Secondary | ICD-10-CM

## 2023-02-03 DIAGNOSIS — M25512 Pain in left shoulder: Secondary | ICD-10-CM

## 2023-02-03 DIAGNOSIS — G8929 Other chronic pain: Secondary | ICD-10-CM

## 2023-02-03 DIAGNOSIS — Z6833 Body mass index (BMI) 33.0-33.9, adult: Secondary | ICD-10-CM

## 2023-02-03 MED ORDER — MELOXICAM 7.5 MG PO TABS: ORAL_TABLET | ORAL | 2 refills | Status: AC

## 2023-02-03 NOTE — Patient Instructions (Signed)

## 2023-02-03 NOTE — Progress Notes (Signed)
Chief Complaint:   OBESITY Regina Jennings is here to discuss her progress with her obesity treatment plan along with follow-up of her obesity related diagnoses. Regina Jennings is on the Category 2 Plan and states she is following her eating plan approximately 25% of the time. Regina Jennings states she is doing 0 minutes 0 times per week.  Today's visit was #: 2 Starting weight: 192 lbs Starting date: 01/18/2023 Today's weight: 191 lbs Today's date: 02/03/2023 Total lbs lost to date: 1 Total lbs lost since last in-office visit: 1  Interim History: Patient did not start meal plan after last appointment as she had just gotten groceries prior to last appointment.  She tried to make healthier choices since the first appointment. She normally orders groceries at the beginning of the month and she has made her grocery list that is in line with the meal plan.  She is planning to use August as a relaxation month.   Subjective:   1. Other hyperlipidemia Patient's recent LDL was 109, HDL 78, and triglycerides 56.  She is not on medications.  10-year ASCVD risk score is 1.2% (optimal 0.7%).  I discussed labs with the patient today.  2. Essential hypertension Patient's blood pressure is controlled today.  She is on Hyzaar.  3. Prediabetes Patient's recent A1c was 5.9 and insulin 9.5.  She is not on medications.  I discussed labs with the patient today.  4. Vitamin D deficiency Patient's recent vitamin D level was of 53.0, and she notes fatigue.  I discussed labs with the patient today.  Assessment/Plan:   1. Other hyperlipidemia Patient is to start her category 2 plan; no medications at this point.  2. Essential hypertension Patient will continue Hyzaar with no change in dose.  We will follow-up on her blood pressure at her next appointment.  3. Prediabetes Pathophysiology of insulin resistance, prediabetes, and diabetes mellitus were discussed today.  Patient is to start her category 2 plan, and we will  repeat labs in 3 months.  4. Vitamin D deficiency Patient will continue OTC vitamin D, and we will repeat vitamin D level in 3 months.  5. BMI 33.0-33.9,adult  6. Obesity with starting BMI of 34.0 Regina Jennings is currently in the action stage of change. As such, her goal is to continue with weight loss efforts. She has agreed to the Category 2 Plan.   Exercise goals: All adults should avoid inactivity. Some physical activity is better than none, and adults who participate in any amount of physical activity gain some health benefits.  Behavioral modification strategies: increasing lean protein intake, meal planning and cooking strategies, keeping healthy foods in the home, and planning for success.  Regina Jennings has agreed to follow-up with our clinic in 2 to 3 weeks. She was informed of the importance of frequent follow-up visits to maximize her success with intensive lifestyle modifications for her multiple health conditions.   Objective:   Blood pressure 123/83, pulse 88, temperature 98.9 F (37.2 C), height 5\' 3"  (1.6 m), weight 191 lb (86.6 kg), last menstrual period 11/02/2022, SpO2 99%. Body mass index is 33.83 kg/m.  General: Cooperative, alert, well developed, in no acute distress. HEENT: Conjunctivae and lids unremarkable. Cardiovascular: Regular rhythm.  Lungs: Normal work of breathing. Neurologic: No focal deficits.   Lab Results  Component Value Date   CREATININE 1.04 (H) 12/02/2022   BUN 14 12/02/2022   NA 138 12/02/2022   K 4.1 12/02/2022   CL 100 12/02/2022   CO2 24 12/02/2022  Lab Results  Component Value Date   ALT 13 12/02/2022   AST 17 12/02/2022   ALKPHOS 63 12/02/2022   BILITOT 0.4 12/02/2022   Lab Results  Component Value Date   HGBA1C 5.9 (H) 12/02/2022   HGBA1C 5.9 (H) 07/27/2022   HGBA1C 5.6 10/05/2021   HGBA1C 5.8 (H) 04/06/2021   HGBA1C 5.6 03/27/2019   Lab Results  Component Value Date   INSULIN 9.5 01/18/2023   INSULIN 10.9 10/05/2021   Lab  Results  Component Value Date   TSH 2.850 01/18/2023   Lab Results  Component Value Date   CHOL 197 12/02/2022   HDL 78 12/02/2022   LDLCALC 109 (H) 12/02/2022   TRIG 56 12/02/2022   CHOLHDL 2.5 12/02/2022   Lab Results  Component Value Date   VD25OH 53.0 01/18/2023   VD25OH 55.7 04/01/2020   Lab Results  Component Value Date   WBC 6.3 12/02/2022   HGB 12.8 12/02/2022   HCT 38.6 01/18/2023   MCV 79 12/02/2022   PLT 272 12/02/2022   Lab Results  Component Value Date   IRON 69 01/18/2023   TIBC 381 01/18/2023   FERRITIN 72 01/18/2023   Attestation Statements:   Reviewed by clinician on day of visit: allergies, medications, problem list, medical history, surgical history, family history, social history, and previous encounter notes.  Time spent on visit including pre-visit chart review and post-visit care and charting was 45 minutes.   I, Burt Knack, am acting as transcriptionist for Reuben Likes, MD.  I have reviewed the above documentation for accuracy and completeness, and I agree with the above. - Reuben Likes, MD

## 2023-02-03 NOTE — Telephone Encounter (Signed)
The patient is requesting to be contacted because she needs to see her PCP today. She mentioned that she was not aware she couldn't see both doctors on the same day. She is upset and would like to have this issue resolved.

## 2023-02-03 NOTE — Progress Notes (Signed)
I,Victoria T Deloria Lair, CMA,acting as a Neurosurgeon for Gwynneth Aliment, MD.,have documented all relevant documentation on the behalf of Gwynneth Aliment, MD,as directed by  Gwynneth Aliment, MD while in the presence of Gwynneth Aliment, MD.  Subjective:  Patient ID: Regina Jennings , female    DOB: December 07, 1975 , 46 y.o.   MRN: [id number]  Chief Complaint  Patient presents with   Cough   Tinder Left Arm    HPI  Patient presents today for acute cough. She reports the cough initially started last Thursday. She admits she has tried allergy medicine, Dayquil & Nyquil at home. She reports her symptoms occur randomly throughout the day, mainly at night.  Denies fever. She admits having headache on Saturday. She denies known ill contacts.       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   Hypertension    Prediabetes    Vitamin D deficiency      Family History  Problem Relation Age of Onset   Cancer Mother    Migraines Mother    Ulcers Mother    Hypertension Mother    Hyperlipidemia Mother    Irritable bowel syndrome Mother    GER disease Mother    Breast cancer Mother        51's   Stroke Father    Hypertension Maternal Grandmother    Stroke Maternal Grandmother    Congestive Heart Failure Maternal Grandmother    Breast cancer Paternal Grandmother    Sleep apnea Neg Hx      Current Outpatient Medications:    buPROPion (WELLBUTRIN XL) 150 MG 24 hr tablet, Take 1 tablet (150 mg total) by mouth every morning., Disp: 30 tablet, Rfl: 2   Cholecalciferol (VITAMIN D) 50 MCG (2000 UT) CAPS, Take 2,000 Units by mouth See admin instructions. Takes 2,000 units every day, Disp: , Rfl:    losartan-hydrochlorothiazide (HYZAAR) 50-12.5 MG tablet, TAKE 1 TABLET BY MOUTH DAILY, Disp: 90 tablet, Rfl: 2   MAGNESIUM GLYCINATE PO, Take by mouth as needed., Disp: , Rfl:    meloxicam (MOBIC) 7.5 MG tablet, One tab po daily as needed, Disp: 30 tablet,  Rfl: 2   nystatin cream (MYCOSTATIN), Apply 1 application topically 2 (two) times daily., Disp: 30 g, Rfl: 0   OPZELURA 1.5 % CREA, Apply 1 application  topically 2 (two) times daily., Disp: , Rfl:    pimecrolimus (ELIDEL) 1 % cream, , Disp: , Rfl:    triamcinolone cream (KENALOG) 0.1 %, Apply topically 2 (two) times daily as needed., Disp: , Rfl:    norethindrone (ORTHO MICRONOR) 0.35 MG tablet, Take 1 tablet (0.35 mg total) by mouth daily., Disp: 28 tablet, Rfl: 11   Allergies  Allergen Reactions   Boric Acid Hives   Flagyl [Metronidazole]    Sulfa Antibiotics Rash   Xyzal [Cetirizine Hcl] Rash     Review of Systems  Constitutional: Negative.   HENT: Negative.    Respiratory:  Positive for cough.   Cardiovascular: Negative.   Gastrointestinal: Negative.   Musculoskeletal:  Positive for arthralgias.       She reports her left arm has bothered her since May. She denies fall/trauma.  She has tried Voltaren gel, without relief of her sx.  She sleeps on her left side. She wants to get an x-ray of her arm.   Neurological: Negative.   Psychiatric/Behavioral: Negative.  Today's Vitals   02/03/23 1211  BP: 118/82  Pulse: 78  Temp: 98.3 F (36.8 C)  SpO2: 98%  Weight: 193 lb 9.6 oz (87.8 kg)  Height: 5\' 3"  (1.6 m)   Body mass index is 34.29 kg/m.  Wt Readings from Last 3 Encounters:  02/03/23 193 lb 9.6 oz (87.8 kg)  02/03/23 191 lb (86.6 kg)  01/18/23 192 lb (87.1 kg)     Objective:  Physical Exam Vitals and nursing note reviewed.  Constitutional:      Appearance: Normal appearance.  HENT:     Head: Normocephalic and atraumatic.     Right Ear: Tympanic membrane, ear canal and external ear normal. There is no impacted cerumen.     Left Ear: Tympanic membrane, ear canal and external ear normal. There is no impacted cerumen.     Mouth/Throat:     Pharynx: Posterior oropharyngeal erythema present.  Eyes:     Extraocular Movements: Extraocular movements intact.   Cardiovascular:     Rate and Rhythm: Normal rate and regular rhythm.     Heart sounds: Normal heart sounds.  Pulmonary:     Effort: Pulmonary effort is normal.     Breath sounds: Normal breath sounds.  Musculoskeletal:        General: Tenderness present.     Cervical back: Normal range of motion.  Skin:    General: Skin is warm.  Neurological:     General: No focal deficit present.     Mental Status: She is alert.  Psychiatric:        Mood and Affect: Mood normal.        Behavior: Behavior normal.         Assessment And Plan:  Acute cough Assessment & Plan: I think her symptoms are due to postnasal drip. She was given samples of Zyrtec, 10mg  nightly. Advised to avoid dairy for the next ten days or so. She will let me know if her symptoms persist.    Chronic left shoulder pain Assessment & Plan: ??? Bursitis. I will send rx meloxicam to take twice daily prn. Advised to take meds with food. I will also refer her to Ortho for further evaluation and radiographic studies. She is in agreement with treatment plan.   Orders: -     Ambulatory referral to Orthopedic Surgery  Other orders -     Meloxicam; One tab po daily as needed  Dispense: 30 tablet; Refill: 2  She is encouraged to strive for BMI less than 30 to decrease cardiac risk. Advised to aim for at least 150 minutes of exercise per week.    Return if symptoms worsen or fail to improve.  Patient was given opportunity to ask questions. Patient verbalized understanding of the plan and was able to repeat key elements of the plan. All questions were answered to their satisfaction.   I, Gwynneth Aliment, MD, have reviewed all documentation for this visit. The documentation on 02/03/23 for the exam, diagnosis, procedures, and orders are all accurate and complete.   IF YOU HAVE BEEN REFERRED TO A SPECIALIST, IT MAY TAKE 1-2 WEEKS TO SCHEDULE/PROCESS THE REFERRAL. IF YOU HAVE NOT HEARD FROM US/SPECIALIST IN TWO WEEKS, PLEASE GIVE  [name] A CALL AT [phone] X [phone].   THE PATIENT IS ENCOURAGED TO PRACTICE SOCIAL DISTANCING DUE TO THE COVID-19 PANDEMIC.

## 2023-02-03 NOTE — Telephone Encounter (Signed)
I attempted to reach the patient. No answer, left message to return the call.

## 2023-02-07 ENCOUNTER — Encounter: Payer: Self-pay | Admitting: Internal Medicine

## 2023-02-07 NOTE — Assessment & Plan Note (Signed)
I think her symptoms are due to postnasal drip. She was given samples of Zyrtec, 10mg  nightly. Advised to avoid dairy for the next ten days or so. She will let me know if her symptoms persist.

## 2023-02-07 NOTE — Assessment & Plan Note (Signed)
???   Bursitis. I will send rx meloxicam to take twice daily prn. Advised to take meds with food. I will also refer her to Ortho for further evaluation and radiographic studies. She is in agreement with treatment plan.

## 2023-02-22 ENCOUNTER — Ambulatory Visit: Payer: BC Managed Care – PPO | Admitting: Orthopaedic Surgery

## 2023-02-22 ENCOUNTER — Other Ambulatory Visit (INDEPENDENT_AMBULATORY_CARE_PROVIDER_SITE_OTHER): Payer: BC Managed Care – PPO

## 2023-02-22 DIAGNOSIS — M25512 Pain in left shoulder: Secondary | ICD-10-CM

## 2023-02-22 DIAGNOSIS — G8929 Other chronic pain: Secondary | ICD-10-CM

## 2023-02-22 NOTE — Progress Notes (Signed)
Office Visit Note   Patient: Regina Jennings           Date of Birth: 05-07-1976           MRN: [id number] Visit Date: 02/22/2023              Requested by: Dorothyann Peng, MD [address] [place],  [place] [address] PCP: Dorothyann Peng, MD   Assessment & Plan: Visit Diagnoses:  1. Chronic left shoulder pain     Plan: Patient is a 47 year old female with chronic left shoulder pain with unclear etiology.  For now she would like to give this more time and continue with Mobic and Voltaren and home exercises that we provided today.  She will follow-up if symptoms persist at which point we would likely suggest an intra-articular steroid injection.  Follow-Up Instructions: No follow-ups on file.   Orders:  Orders Placed This Encounter  Procedures   XR Shoulder Left   No orders of the defined types were placed in this encounter.     Procedures: No procedures performed   Clinical Data: No additional findings.   Subjective: Chief Complaint  Patient presents with   Left Shoulder - Pain    HPI Patient is a very pleasant 47 year old female here for evaluation of left shoulder pain for about 4 to 5 months.  Feels like it is not getting any better.  Denies any injuries or changes in activity.  Denies any neck pain or radicular symptoms.  Reports pain with range of motion.  Denies any stiffness.  Uses Voltaren gel with some relief.  Right-hand-dominant.  Feels like the pain is deep inside her shoulder from the back.  She is prediabetic. Review of Systems  Constitutional: Negative.   HENT: Negative.    Eyes: Negative.   Respiratory: Negative.    Cardiovascular: Negative.   Endocrine: Negative.   Musculoskeletal: Negative.   Neurological: Negative.   Hematological: Negative.   Psychiatric/Behavioral: Negative.    All other systems reviewed and are negative.    Objective: Vital Signs: LMP 11/02/2022 (Exact Date) Comment: she reports no menstrual since  april.  Physical Exam Vitals and nursing note reviewed.  Constitutional:      Appearance: She is well-developed.  HENT:     Head: Atraumatic.     Nose: Nose normal.  Eyes:     Extraocular Movements: Extraocular movements intact.  Cardiovascular:     Pulses: Normal pulses.  Pulmonary:     Effort: Pulmonary effort is normal.  Abdominal:     Palpations: Abdomen is soft.  Musculoskeletal:     Cervical back: Neck supple.  Skin:    General: Skin is warm.     Capillary Refill: Capillary refill takes less than 2 seconds.  Neurological:     Mental Status: She is alert. Mental status is at baseline.  Psychiatric:        Behavior: Behavior normal.        Thought Content: Thought content normal.        Judgment: Judgment normal.     Ortho Exam Examination of the left shoulder shows full passive and active range of motion with good strength of the rotator cuff to manual muscle testing.  No impingement signs.  Slight pain with O'Brien's sign.  AC joint and acromion are nontender.  Specialty Comments:  No specialty comments available.  Imaging: XR Shoulder Left  Result Date: 02/22/2023 X-rays demonstrate an os acromiale.  No acute abnormalities.    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   Hypertension    Prediabetes    Vitamin D deficiency     Family History  Problem Relation Age of Onset   Cancer Mother     Migraines Mother    Ulcers Mother    Hypertension Mother    Hyperlipidemia Mother    Irritable bowel syndrome Mother    GER disease Mother    Breast cancer Mother        28's   Stroke Father    Hypertension Maternal Grandmother    Stroke Maternal Grandmother    Congestive Heart Failure Maternal Grandmother    Breast cancer Paternal Grandmother    Sleep apnea Neg Hx     Past Surgical History:  Procedure Laterality Date   WISDOM TOOTH EXTRACTION     Social History   Occupational History   Occupation: Architect  Tobacco Use   Smoking status: Never   Smokeless tobacco: Never  Vaping Use   Vaping status: Never Used  Substance and Sexual Activity   Alcohol use: Yes    Comment: seldom, maybe twice a year   Drug use: No   Sexual activity: Yes    Birth control/protection: Pill    Comment: JOLESSA

## 2023-02-24 ENCOUNTER — Encounter (INDEPENDENT_AMBULATORY_CARE_PROVIDER_SITE_OTHER): Payer: Self-pay | Admitting: Family Medicine

## 2023-02-24 ENCOUNTER — Ambulatory Visit (INDEPENDENT_AMBULATORY_CARE_PROVIDER_SITE_OTHER): Payer: BC Managed Care – PPO | Admitting: Family Medicine

## 2023-02-24 VITALS — BP 116/81 | HR 84 | Temp 98.5°F | Ht 63.0 in | Wt 190.0 lb

## 2023-02-24 DIAGNOSIS — E669 Obesity, unspecified: Secondary | ICD-10-CM

## 2023-02-24 DIAGNOSIS — Z6833 Body mass index (BMI) 33.0-33.9, adult: Secondary | ICD-10-CM

## 2023-02-24 DIAGNOSIS — E66811 Obesity, class 1: Secondary | ICD-10-CM

## 2023-02-24 DIAGNOSIS — R7303 Prediabetes: Secondary | ICD-10-CM | POA: Diagnosis not present

## 2023-02-24 DIAGNOSIS — I10 Essential (primary) hypertension: Secondary | ICD-10-CM | POA: Diagnosis not present

## 2023-02-24 NOTE — Progress Notes (Signed)
Chief Complaint:   OBESITY Regina Jennings is here to discuss her progress with her obesity treatment plan along with follow-up of her obesity related diagnoses. Regina Jennings is on the Category 2 Plan and states she is following her eating plan approximately 75% of the time. Regina Jennings states she is doing 0 minutes 0 times per week.  Today's visit was #: 3 Starting weight: 192 lbs Starting date: 01/18/2023 Today's weight: 190 lbs Today's date: 02/24/2023 Total lbs lost to date: 2 Total lbs lost since last in-office visit: 1  Interim History: Patient has been mostly planning and volunteering since last appointment.  Since last visit she has been focused on meal plan but challenge is feeling boxed in.  She likes batch cooking because she feels this works better. She is trying to figure all this out.  For Labor Day weekend she is looking forward to being off work.  She is traveling for work in the month of September which is fun for her. She is traveling to [place] for work.  Subjective:   1. Essential hypertension Patient's blood pressure is controlled today.  She denies chest pain, chest pressure, or headache.  2. Prediabetes Patient's last A1c was 5.9 and insulin 9.5.  She is not on medications.  She reports cravings at night particularly after dinner.  Assessment/Plan:   1. Essential hypertension Patient will continue Hyzaar with no change in medication or dose.  2. Prediabetes We discussed incorporation of low calorie foods to satisfy salty crunchy snack desire.  3. BMI 33.0-33.9,adult  4. Obesity with starting BMI of 34.0 Regina Jennings is currently in the action stage of change. As such, her goal is to continue with weight loss efforts. She has agreed to the Category 2 Plan.   Exercise goals: No exercise has been prescribed at this time.  Behavioral modification strategies: increasing lean protein intake, meal planning and cooking strategies, keeping healthy foods in the home, and planning  for success.  Regina Jennings has agreed to follow-up with our clinic in 3 weeks. She was informed of the importance of frequent follow-up visits to maximize her success with intensive lifestyle modifications for her multiple health conditions.   Objective:   Blood pressure 116/81, pulse 84, temperature 98.5 F (36.9 C), height 5\' 3"  (1.6 m), weight 190 lb (86.2 kg), last menstrual period 11/02/2022, SpO2 99%. Body mass index is 33.66 kg/m.  General: Cooperative, alert, well developed, in no acute distress. HEENT: Conjunctivae and lids unremarkable. Cardiovascular: Regular rhythm.  Lungs: Normal work of breathing. Neurologic: No focal deficits.   Lab Results  Component Value Date   CREATININE 1.04 (H) 12/02/2022   BUN 14 12/02/2022   NA 138 12/02/2022   K 4.1 12/02/2022   CL 100 12/02/2022   CO2 24 12/02/2022   Lab Results  Component Value Date   ALT 13 12/02/2022   AST 17 12/02/2022   ALKPHOS 63 12/02/2022   BILITOT 0.4 12/02/2022   Lab Results  Component Value Date   HGBA1C 5.9 (H) 12/02/2022   HGBA1C 5.9 (H) 07/27/2022   HGBA1C 5.6 10/05/2021   HGBA1C 5.8 (H) 04/06/2021   HGBA1C 5.6 03/27/2019   Lab Results  Component Value Date   INSULIN 9.5 01/18/2023   INSULIN 10.9 10/05/2021   Lab Results  Component Value Date   TSH 2.850 01/18/2023   Lab Results  Component Value Date   CHOL 197 12/02/2022   HDL 78 12/02/2022   LDLCALC 109 (H) 12/02/2022   TRIG 56 12/02/2022  CHOLHDL 2.5 12/02/2022   Lab Results  Component Value Date   VD25OH 53.0 01/18/2023   VD25OH 55.7 04/01/2020   Lab Results  Component Value Date   WBC 6.3 12/02/2022   HGB 12.8 12/02/2022   HCT 38.6 01/18/2023   MCV 79 12/02/2022   PLT 272 12/02/2022   Lab Results  Component Value Date   IRON 69 01/18/2023   TIBC 381 01/18/2023   FERRITIN 72 01/18/2023   Attestation Statements:   Reviewed by clinician on day of visit: allergies, medications, problem list, medical history, surgical  history, family history, social history, and previous encounter notes.   I, Burt Knack, am acting as transcriptionist for Reuben Likes, MD.  I have reviewed the above documentation for accuracy and completeness, and I agree with the above. - Reuben Likes, MD

## 2023-03-10 ENCOUNTER — Encounter (INDEPENDENT_AMBULATORY_CARE_PROVIDER_SITE_OTHER): Payer: Self-pay | Admitting: Family Medicine

## 2023-03-10 ENCOUNTER — Telehealth (INDEPENDENT_AMBULATORY_CARE_PROVIDER_SITE_OTHER): Payer: BC Managed Care – PPO | Admitting: Family Medicine

## 2023-03-10 DIAGNOSIS — Z6833 Body mass index (BMI) 33.0-33.9, adult: Secondary | ICD-10-CM | POA: Diagnosis not present

## 2023-03-10 DIAGNOSIS — E669 Obesity, unspecified: Secondary | ICD-10-CM | POA: Diagnosis not present

## 2023-03-10 DIAGNOSIS — I10 Essential (primary) hypertension: Secondary | ICD-10-CM

## 2023-03-10 NOTE — Progress Notes (Signed)
  TeleHealth Visit:  This visit was completed with telemedicine (audio/video) technology. Regina Jennings has verbally consented to this TeleHealth visit. The patient is located at home, the provider is located at home. The participants in this visit include the listed provider and patient. The visit was conducted today via MyChart video.  OBESITY Regina Jennings is here to discuss her progress with her obesity treatment plan along with follow-up of her obesity related diagnoses.   Today's visit was # 4 Starting weight: 192 lbs Starting date: 01/18/23 Weight at last in office visit: 190 lbs on 02/24/23 Total weight loss: 2 lbs at last in office visit on 02/24/23. Today's reported weight (03/10/23): none reported  Nutrition Plan: the Category 2 plan  Current exercise:  none  Interim History:  She is pleased with her progress. Shopping and prepping are a challenge for her. She orders her groceries an finds this beneficial for saving money. She likes to batch cook and make meals ahead.  She struggles to get in all of the protein at lunch and dinner. Sometimes has [employer] shake with collagen for breakfast. Water intake is low-40 oz day.  Assessment/Plan:  1. Hypertension Hypertension well controlled.  Medication(s): losartan-hydrochlorothiazide 50-12.5 mg daily.  BP Readings from Last 3 Encounters:  02/24/23 116/81  02/03/23 118/82  02/03/23 123/83   Lab Results  Component Value Date   CREATININE 1.04 (H) 12/02/2022   CREATININE 1.05 (H) 07/27/2022   CREATININE 1.03 (H) 10/05/2021   No results found for: "GFR"  Plan: Continue losartan-hydrochlorothiazide 50-12.5 mg daily.  2. Generalized Obesity: Current BMI 33  Regina Jennings is currently in the action stage of change. As such, her goal is to continue with weight loss efforts.  She has agreed to the Category 3 plan.  1. Increase water to 64 oz/day.  Exercise goals: No exercise has been prescribed at this time.  Behavioral  modification strategies: increasing lean protein intake, meal planning , and increase water intake.  Regina Jennings has agreed to follow-up with our clinic in 6 weeks.  No orders of the defined types were placed in this encounter.   There are no discontinued medications.   No orders of the defined types were placed in this encounter.     Objective:   VITALS: Per patient if applicable, see vitals. GENERAL: Alert and in no acute distress. CARDIOPULMONARY: No increased WOB. Speaking in clear sentences.  PSYCH: Pleasant and cooperative. Speech normal rate and rhythm. Affect is appropriate. Insight and judgement are appropriate. Attention is focused, linear, and appropriate.  NEURO: Oriented as arrived to appointment on time with no prompting.   Attestation Statements:   Reviewed by clinician on day of visit: allergies, medications, problem list, medical history, surgical history, family history, social history, and previous encounter notes.   This was prepared with the assistance of Engineer, civil (consulting).  Occasional wrong-word or sound-a-like substitutions may have occurred due to the inherent limitations of voice recognition software.

## 2023-03-25 ENCOUNTER — Other Ambulatory Visit: Payer: Self-pay | Admitting: Internal Medicine

## 2023-04-12 ENCOUNTER — Ambulatory Visit: Payer: BC Managed Care – PPO

## 2023-04-12 VITALS — BP 122/80 | HR 74 | Temp 98.7°F | Ht 63.0 in | Wt 190.0 lb

## 2023-04-12 DIAGNOSIS — Z23 Encounter for immunization: Secondary | ICD-10-CM

## 2023-04-12 NOTE — Progress Notes (Signed)
Patient presents today for her flu and covid shot, patient received flu in left arm and covid in right arm.

## 2023-04-18 ENCOUNTER — Encounter (INDEPENDENT_AMBULATORY_CARE_PROVIDER_SITE_OTHER): Payer: Self-pay | Admitting: Family Medicine

## 2023-04-18 ENCOUNTER — Ambulatory Visit (INDEPENDENT_AMBULATORY_CARE_PROVIDER_SITE_OTHER): Payer: BC Managed Care – PPO | Admitting: Family Medicine

## 2023-04-18 VITALS — BP 121/75 | HR 74 | Temp 98.4°F | Ht 63.0 in | Wt 191.0 lb

## 2023-04-18 DIAGNOSIS — I10 Essential (primary) hypertension: Secondary | ICD-10-CM | POA: Diagnosis not present

## 2023-04-18 DIAGNOSIS — E669 Obesity, unspecified: Secondary | ICD-10-CM

## 2023-04-18 DIAGNOSIS — R7303 Prediabetes: Secondary | ICD-10-CM | POA: Diagnosis not present

## 2023-04-18 DIAGNOSIS — Z6833 Body mass index (BMI) 33.0-33.9, adult: Secondary | ICD-10-CM | POA: Diagnosis not present

## 2023-04-18 DIAGNOSIS — E66811 Obesity, class 1: Secondary | ICD-10-CM

## 2023-04-18 NOTE — Progress Notes (Unsigned)
Chief Complaint:   OBESITY Regina Jennings is here to discuss her progress with her obesity treatment plan along with follow-up of her obesity related diagnoses. Regina Jennings is on the Category 2 Plan and states she is following her eating plan approximately 50% of the time. Regina Jennings states she is doing 0 minutes 0 times per week.  Today's visit was #: 5 Starting weight: 192 lbs Starting date: 01/18/2023 Today's weight: 191 lbs Today's date: 04/18/2023 Total lbs lost to date: 1 Total lbs lost since last in-office visit: 0  Interim History: Patient voices she didn't pay attention as much as she should have in the last few weeks since she was here in person last.  She has had numerous birthdays and celebrations.  She has travel and a major celebration for work this week.  It is [employer] homecoming.  She is going to [place], [place] at the end of the month.  She recognizes that she needs to stay more in control in the food choices she is making while she can.  Currently she isn't sure what her plans will be for the holidays.  Subjective:   1. Essential hypertension Patient's blood pressure is controlled today.  She denies chest pain, chest pressure, or headache.  She is on Hyzaar daily.  2. Prediabetes Patient's last A1c was 5.9 and insulin 9.5.  She is not on medications.  Assessment/Plan:   1. Essential hypertension Patient will continue her current medications at her same doses.  2. Prediabetes We will repeat labs at patient's next appointment.   3. BMI 33.0-33.9,adult  4. Obesity with starting BMI of 34.0 Regina Jennings is currently in the action stage of change. As such, her goal is to continue with weight loss efforts. She has agreed to the Category 2 Plan.   Exercise goals: No exercise has been prescribed at this time.  Behavioral modification strategies: increasing lean protein intake, meal planning and cooking strategies, keeping healthy foods in the home, and planning for success.  Regina Jennings has  agreed to follow-up with our clinic in 3 weeks. She was informed of the importance of frequent follow-up visits to maximize her success with intensive lifestyle modifications for her multiple health conditions.   Objective:   Blood pressure 121/75, pulse 74, temperature 98.4 F (36.9 C), height 5\' 3"  (1.6 m), weight 191 lb (86.6 kg), SpO2 100%. Body mass index is 33.83 kg/m.  General: Cooperative, alert, well developed, in no acute distress. HEENT: Conjunctivae and lids unremarkable. Cardiovascular: Regular rhythm.  Lungs: Normal work of breathing. Neurologic: No focal deficits.   Lab Results  Component Value Date   CREATININE 1.04 (H) 12/02/2022   BUN 14 12/02/2022   NA 138 12/02/2022   K 4.1 12/02/2022   CL 100 12/02/2022   CO2 24 12/02/2022   Lab Results  Component Value Date   ALT 13 12/02/2022   AST 17 12/02/2022   ALKPHOS 63 12/02/2022   BILITOT 0.4 12/02/2022   Lab Results  Component Value Date   HGBA1C 5.9 (H) 12/02/2022   HGBA1C 5.9 (H) 07/27/2022   HGBA1C 5.6 10/05/2021   HGBA1C 5.8 (H) 04/06/2021   HGBA1C 5.6 03/27/2019   Lab Results  Component Value Date   INSULIN 9.5 01/18/2023   INSULIN 10.9 10/05/2021   Lab Results  Component Value Date   TSH 2.850 01/18/2023   Lab Results  Component Value Date   CHOL 197 12/02/2022   HDL 78 12/02/2022   LDLCALC 109 (H) 12/02/2022   TRIG 56  12/02/2022   CHOLHDL 2.5 12/02/2022   Lab Results  Component Value Date   VD25OH 53.0 01/18/2023   VD25OH 55.7 04/01/2020   Lab Results  Component Value Date   WBC 6.3 12/02/2022   HGB 12.8 12/02/2022   HCT 38.6 01/18/2023   MCV 79 12/02/2022   PLT 272 12/02/2022   Lab Results  Component Value Date   IRON 69 01/18/2023   TIBC 381 01/18/2023   FERRITIN 72 01/18/2023   Attestation Statements:   Reviewed by clinician on day of visit: allergies, medications, problem list, medical history, surgical history, family history, social history, and previous encounter  notes.   I, Burt Knack, am acting as transcriptionist for Reuben Likes, MD.  I have reviewed the above documentation for accuracy and completeness, and I agree with the above. - Reuben Likes, MD

## 2023-04-21 ENCOUNTER — Ambulatory Visit (INDEPENDENT_AMBULATORY_CARE_PROVIDER_SITE_OTHER): Payer: BC Managed Care – PPO | Admitting: Family Medicine

## 2023-05-12 ENCOUNTER — Ambulatory Visit (INDEPENDENT_AMBULATORY_CARE_PROVIDER_SITE_OTHER): Payer: BC Managed Care – PPO | Admitting: Family Medicine

## 2023-05-12 ENCOUNTER — Encounter (INDEPENDENT_AMBULATORY_CARE_PROVIDER_SITE_OTHER): Payer: Self-pay | Admitting: Family Medicine

## 2023-05-12 VITALS — BP 127/81 | HR 77 | Temp 98.4°F | Ht 63.0 in | Wt 192.0 lb

## 2023-05-12 DIAGNOSIS — R7303 Prediabetes: Secondary | ICD-10-CM

## 2023-05-12 DIAGNOSIS — E669 Obesity, unspecified: Secondary | ICD-10-CM

## 2023-05-12 DIAGNOSIS — I10 Essential (primary) hypertension: Secondary | ICD-10-CM

## 2023-05-12 DIAGNOSIS — Z6834 Body mass index (BMI) 34.0-34.9, adult: Secondary | ICD-10-CM | POA: Diagnosis not present

## 2023-05-12 DIAGNOSIS — E66811 Obesity, class 1: Secondary | ICD-10-CM

## 2023-05-12 NOTE — Assessment & Plan Note (Signed)
On losartan--hydrochlorothiazide 50-12.5.  Great BP control today and no chest pain, chest pressure or headache.  BP at goal so no change to medications at this time.

## 2023-05-12 NOTE — Assessment & Plan Note (Signed)
A1c at 5.9 on initial check.  Has been somewhat indulgent when traveling but realizes she needs to get in better control (this includes getting groceries).

## 2023-05-12 NOTE — Progress Notes (Signed)
   SUBJECTIVE:  Chief Complaint: Obesity  Interim History: Patient has traveled a bit since last appointment and tried to ensure there were greens on her plate every meal.  She needs to introduce some exercise and needs to follow someone else or participate in a group class. In the next few weeks she needs to go get groceries.  She is experiencing some financial challenges currently which has limited her ability to get groceries.  She is traveling next week to [facility] and after that will be here for a while.   Regina Jennings is here to discuss her progress with her obesity treatment plan. She is on the Category 2 Plan and states she is following her eating plan approximately 50 % of the time. She states she is no exercising.  OBJECTIVE: Visit Diagnoses: Problem List Items Addressed This Visit       Cardiovascular and Mediastinum   Hypertension    On losartan--hydrochlorothiazide 50-12.5.  Great BP control today and no chest pain, chest pressure or headache.  BP at goal so no change to medications at this time.        Other   Prediabetes    A1c at 5.9 on initial check.  Has been somewhat indulgent when traveling but realizes she needs to get in better control (this includes getting groceries).      Other Visit Diagnoses     Obesity with starting BMI of 34.0    -  Primary   BMI 34.0-34.9,adult           Vitals Temp: 98.4 F (36.9 C) BP: 127/81 Pulse Rate: 77 SpO2: 100 %   Anthropometric Measurements Height: 5\' 3"  (1.6 m) Weight: 192 lb (87.1 kg) BMI (Calculated): 34.02 Weight at Last Visit: 191 lb Weight Lost Since Last Visit: 0 Weight Gained Since Last Visit: 1 Starting Weight: 192 lb Total Weight Loss (lbs): 0 lb (0 kg)   Body Composition  Body Fat %: 43.2 % Fat Mass (lbs): 83 lbs Muscle Mass (lbs): 103.4 lbs Total Body Water (lbs): 70.2 lbs Visceral Fat Rating : 11   Other Clinical Data Today's Visit #: 5 Starting Date: 01/18/23     ASSESSMENT AND  PLAN:  Diet: Regina Jennings is currently in the action stage of change. As such, her goal is to continue with weight loss efforts. She has agreed to Category 2 Plan.  Exercise: Regina Jennings has been instructed that some exercise is better than none for weight loss and overall health benefits.   Behavior Modification:  We discussed the following Behavioral Modification Strategies today: increasing lean protein intake, meal planning and cooking strategies, better snacking choices, and travel eating strategies.   No follow-ups on file.Marland Kitchen She was informed of the importance of frequent follow up visits to maximize her success with intensive lifestyle modifications for her multiple health conditions.  Attestation Statements:   Reviewed by clinician on day of visit: allergies, medications, problem list, medical history, surgical history, family history, social history, and previous encounter notes.  Reuben Likes, MD

## 2023-06-06 ENCOUNTER — Encounter: Payer: Self-pay | Admitting: Internal Medicine

## 2023-06-06 ENCOUNTER — Ambulatory Visit: Payer: BC Managed Care – PPO | Admitting: Internal Medicine

## 2023-06-06 VITALS — BP 128/84 | HR 71 | Temp 97.8°F | Ht 63.0 in | Wt 194.4 lb

## 2023-06-06 DIAGNOSIS — M25572 Pain in left ankle and joints of left foot: Secondary | ICD-10-CM

## 2023-06-06 DIAGNOSIS — G8929 Other chronic pain: Secondary | ICD-10-CM | POA: Diagnosis not present

## 2023-06-06 DIAGNOSIS — E66811 Obesity, class 1: Secondary | ICD-10-CM

## 2023-06-06 DIAGNOSIS — R7303 Prediabetes: Secondary | ICD-10-CM

## 2023-06-06 DIAGNOSIS — I10 Essential (primary) hypertension: Secondary | ICD-10-CM | POA: Diagnosis not present

## 2023-06-06 DIAGNOSIS — Z6834 Body mass index (BMI) 34.0-34.9, adult: Secondary | ICD-10-CM

## 2023-06-06 DIAGNOSIS — E6609 Other obesity due to excess calories: Secondary | ICD-10-CM

## 2023-06-06 NOTE — Progress Notes (Signed)
I,Victoria T Deloria Lair, CMA,acting as a Neurosurgeon for Gwynneth Aliment, MD.,have documented all relevant documentation on the behalf of Gwynneth Aliment, MD,as directed by  Gwynneth Aliment, MD while in the presence of Gwynneth Aliment, MD.  Subjective:  Patient ID: Regina Jennings , female    DOB: [birth year] , 47 y.o.   MRN: [id number]  Chief Complaint  Patient presents with   Hypertension   Prediabetes    HPI  She presents for BP & prediabetes check today. She reports compliance with meds. She denies headaches, chest pain and shortness of breath. She has no specific question or concerns. She is now followed at [facility], she is pleased with her provider. She admits she needs to start a regular exercise regimen.   Hypertension This is a chronic problem. The current episode started more than 1 year ago. The problem has been gradually improving since onset. The problem is controlled. Pertinent negatives include no blurred vision, chest pain, palpitations or shortness of breath. Past treatments include angiotensin blockers.     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   Hypertension    Prediabetes    Vitamin D deficiency      Family History  Problem Relation Age of Onset   Cancer Mother    Migraines Mother    Ulcers Mother    Hypertension Mother    Hyperlipidemia Mother    Irritable bowel syndrome Mother    GER disease Mother    Breast cancer Mother        78's   Stroke Father    Hypertension Maternal Grandmother    Stroke Maternal Grandmother    Congestive Heart Failure Maternal Grandmother    Breast cancer Paternal Grandmother    Sleep apnea Neg Hx      Current Outpatient Medications:    Cholecalciferol (VITAMIN D) 50 MCG (2000 UT) CAPS, Take 2,000 Units by mouth See admin instructions. Takes 2,000 units every day, Disp: , Rfl:    losartan-hydrochlorothiazide (HYZAAR) 50-12.5 MG tablet, TAKE 1 TABLET BY MOUTH DAILY,  Disp: 90 tablet, Rfl: 2   MAGNESIUM GLYCINATE PO, Take by mouth as needed., Disp: , Rfl:    meloxicam (MOBIC) 7.5 MG tablet, One tab po daily as needed, Disp: 30 tablet, Rfl: 2   nystatin cream (MYCOSTATIN), Apply 1 application topically 2 (two) times daily., Disp: 30 g, Rfl: 0   OPZELURA 1.5 % CREA, Apply 1 application  topically 2 (two) times daily., Disp: , Rfl:    pimecrolimus (ELIDEL) 1 % cream, , Disp: , Rfl:    triamcinolone cream (KENALOG) 0.1 %, Apply topically 2 (two) times daily as needed., Disp: , Rfl:    buPROPion (WELLBUTRIN XL) 150 MG 24 hr tablet, Take 1 tablet (150 mg total) by mouth every morning. (Patient not taking: Reported on 05/12/2023), Disp: 30 tablet, Rfl: 2   norethindrone (ORTHO MICRONOR) 0.35 MG tablet, Take 1 tablet (0.35 mg total) by mouth daily., Disp: 28 tablet, Rfl: 11   Allergies  Allergen Reactions   Boric Acid Hives   Flagyl [Metronidazole]    Sulfa Antibiotics Rash   Xyzal [Cetirizine Hcl] Rash     Review of Systems  Constitutional: Negative.   Eyes:  Negative for blurred vision.  Respiratory: Negative.  Negative for shortness of breath.   Cardiovascular: Negative.  Negative for chest pain and palpitations.  Gastrointestinal: Negative.   Musculoskeletal:  Positive for arthralgias.  She reports having chronic ankle pain which has affected her ability to exercise somewhat. She was previously line dancing on a regular basis, but something she did in class caused the pain. She was evaluated by Ortho, given brace however, it was difficult to wear in her shoes.   Neurological: Negative.   Psychiatric/Behavioral: Negative.       Today's Vitals   06/06/23 1550  BP: 128/84  Pulse: 71  Temp: 97.8 F (36.6 C)  SpO2: 98%  Weight: 194 lb 6.4 oz (88.2 kg)  Height: 5\' 3"  (1.6 m)   Body mass index is 34.44 kg/m.  Wt Readings from Last 3 Encounters:  06/06/23 194 lb 6.4 oz (88.2 kg)  05/12/23 192 lb (87.1 kg)  04/18/23 191 lb (86.6 kg)      Objective:  Physical Exam Vitals and nursing note reviewed.  Constitutional:      Appearance: Normal appearance. She is obese.  HENT:     Head: Normocephalic and atraumatic.  Eyes:     Extraocular Movements: Extraocular movements intact.  Cardiovascular:     Rate and Rhythm: Normal rate and regular rhythm.     Heart sounds: Normal heart sounds.  Pulmonary:     Effort: Pulmonary effort is normal.     Breath sounds: Normal breath sounds.  Musculoskeletal:     Cervical back: Normal range of motion.  Skin:    General: Skin is warm.  Neurological:     General: No focal deficit present.     Mental Status: She is alert.  Psychiatric:        Mood and Affect: Mood normal.        Behavior: Behavior normal.         Assessment And Plan:  Essential hypertension, benign Assessment & Plan: Chronic, fair control. Goal BP<120/80.  She will continue with losartan/hct 50/12.5mg  daily. No med changes today. Pt encouraged to incorporate more exercise into her daily routine. She will rto in June 2025 for her next physical examination.   Orders: -     CMP14+EGFR  Prediabetes Assessment & Plan: Her a1c has been elevated in the past. I will recheck an a1c today, encouraged to limit intake of sugary beverages/foods and processed meats.    Orders: -     CMP14+EGFR -     Hemoglobin A1c  Chronic pain of left ankle Assessment & Plan: [facility] notes from Jan 2024 reviewed in detail. Previously diagnosed with posterior tibial tendinitis. Advised to wear ankle sleeve during exercise to see if this provides her any relief. She may also apply Voltaren gel to affected area two to three times daily prn.    Class 1 obesity due to excess calories with serious comorbidity and body mass index (BMI) of 34.0 to 34.9 in adult Assessment & Plan: She is encouraged to strive for BMI less than 30 to decrease cardiac risk. Advised to aim for at least 150 minutes of exercise per week.  [facility] provider input  appreciated.    She is encouraged to strive for BMI less than 30 to decrease cardiac risk. Advised to aim for at least 150 minutes of exercise per week.    Return if symptoms worsen or fail to improve.  Patient was given opportunity to ask questions. Patient verbalized understanding of the plan and was able to repeat key elements of the plan. All questions were answered to their satisfaction.   I, Gwynneth Aliment, MD, have reviewed all documentation for this visit. The documentation on 06/06/23  for the exam, diagnosis, procedures, and orders are all accurate and complete.   IF YOU HAVE BEEN REFERRED TO A SPECIALIST, IT MAY TAKE 1-2 WEEKS TO SCHEDULE/PROCESS THE REFERRAL. IF YOU HAVE NOT HEARD FROM US/SPECIALIST IN TWO WEEKS, PLEASE GIVE [name] A CALL AT [phone].   THE PATIENT IS ENCOURAGED TO PRACTICE SOCIAL DISTANCING DUE TO THE COVID-19 PANDEMIC.

## 2023-06-06 NOTE — Assessment & Plan Note (Signed)
Her a1c has been elevated in the past. I will recheck an a1c today, encouraged to limit intake of sugary beverages/foods and processed meats.

## 2023-06-06 NOTE — Assessment & Plan Note (Addendum)
[facility] notes from Jan 2024 reviewed in detail. Previously diagnosed with posterior tibial tendinitis. Advised to wear ankle sleeve during exercise to see if this provides her any relief. She may also apply Voltaren gel to affected area two to three times daily prn.

## 2023-06-06 NOTE — Assessment & Plan Note (Addendum)
Chronic, fair control. Goal BP<120/80.  She will continue with losartan/hct 50/12.5mg  daily. No med changes today. Pt encouraged to incorporate more exercise into her daily routine. She will rto in June 2025 for her next physical examination.

## 2023-06-06 NOTE — Assessment & Plan Note (Addendum)
She is encouraged to strive for BMI less than 30 to decrease cardiac risk. Advised to aim for at least 150 minutes of exercise per week.  [facility] provider input appreciated.

## 2023-06-06 NOTE — Patient Instructions (Signed)
Hypertension, Adult Hypertension is another name for high blood pressure. High blood pressure forces your heart to work harder to pump blood. This can cause problems over time. There are two numbers in a blood pressure reading. There is a top number (systolic) over a bottom number (diastolic). It is best to have a blood pressure that is below 120/80. What are the causes? The cause of this condition is not known. Some other conditions can lead to high blood pressure. What increases the risk? Some lifestyle factors can make you more likely to develop high blood pressure: Smoking. Not getting enough exercise or physical activity. Being overweight. Having too much fat, sugar, calories, or salt (sodium) in your diet. Drinking too much alcohol. Other risk factors include: Having any of these conditions: Heart disease. Diabetes. High cholesterol. Kidney disease. Obstructive sleep apnea. Having a family history of high blood pressure and high cholesterol. Age. The risk increases with age. Stress. What are the signs or symptoms? High blood pressure may not cause symptoms. Very high blood pressure (hypertensive crisis) may cause: Headache. Fast or uneven heartbeats (palpitations). Shortness of breath. Nosebleed. Vomiting or feeling like you may vomit (nauseous). Changes in how you see. Very bad chest pain. Feeling dizzy. Seizures. How is this treated? This condition is treated by making healthy lifestyle changes, such as: Eating healthy foods. Exercising more. Drinking less alcohol. Your doctor may prescribe medicine if lifestyle changes do not help enough and if: Your top number is above 130. Your bottom number is above 80. Your personal target blood pressure may vary. Follow these instructions at home: Eating and drinking  If told, follow the DASH eating plan. To follow this plan: Fill one half of your plate at each meal with fruits and vegetables. Fill one fourth of your plate  at each meal with whole grains. Whole grains include whole-wheat pasta, brown rice, and whole-grain bread. Eat or drink low-fat dairy products, such as skim milk or low-fat yogurt. Fill one fourth of your plate at each meal with low-fat (lean) proteins. Low-fat proteins include fish, chicken without skin, eggs, beans, and tofu. Avoid fatty meat, cured and processed meat, or chicken with skin. Avoid pre-made or processed food. Limit the amount of salt in your diet to less than 1,500 mg each day. Do not drink alcohol if: Your doctor tells you not to drink. You are pregnant, may be pregnant, or are planning to become pregnant. If you drink alcohol: Limit how much you have to: 0-1 drink a day for women. 0-2 drinks a day for men. Know how much alcohol is in your drink. In the [place] one drink equals one 12 oz bottle of beer (355 mL), one 5 oz glass of wine (148 mL), or one 1 oz glass of hard liquor (44 mL). Lifestyle  Work with your doctor to stay at a healthy weight or to lose weight. Ask your doctor what the best weight is for you. Get at least 30 minutes of exercise that causes your heart to beat faster (aerobic exercise) most days of the week. This may include walking, swimming, or biking. Get at least 30 minutes of exercise that strengthens your muscles (resistance exercise) at least 3 days a week. This may include lifting weights or doing Pilates. Do not smoke or use any products that contain nicotine or tobacco. If you need help quitting, ask your doctor. Check your blood pressure at home as told by your doctor. Keep all follow-up visits. Medicines Take over-the-counter and prescription medicines   only as told by your doctor. Follow directions carefully. Do not skip doses of blood pressure medicine. The medicine does not work as well if you skip doses. Skipping doses also puts you at risk for problems. Ask your doctor about side effects or reactions to medicines that you should watch  for. Contact a doctor if: You think you are having a reaction to the medicine you are taking. You have headaches that keep coming back. You feel dizzy. You have swelling in your ankles. You have trouble with your vision. Get help right away if: You get a very bad headache. You start to feel mixed up (confused). You feel weak or numb. You feel faint. You have very bad pain in your: Chest. Belly (abdomen). You vomit more than once. You have trouble breathing. These symptoms may be an emergency. Get help right away. Call 911. Do not wait to see if the symptoms will go away. Do not drive yourself to the hospital. Summary Hypertension is another name for high blood pressure. High blood pressure forces your heart to work harder to pump blood. For most people, a normal blood pressure is less than 120/80. Making healthy choices can help lower blood pressure. If your blood pressure does not get lower with healthy choices, you may need to take medicine. This information is not intended to replace advice given to you by your health care provider. Make sure you discuss any questions you have with your health care provider. Document Revised: 04/09/2021 Document Reviewed: 04/09/2021 Elsevier Patient Education  2024 Elsevier Inc.  

## 2023-06-07 ENCOUNTER — Ambulatory Visit: Payer: BC Managed Care – PPO | Admitting: Internal Medicine

## 2023-06-07 LAB — HEMOGLOBIN A1C
Est. average glucose Bld gHb Est-mCnc: 128 mg/dL
Hgb A1c MFr Bld: 6.1 % — ABNORMAL HIGH (ref 4.8–5.6)

## 2023-06-07 LAB — CMP14+EGFR
ALT: 11 [IU]/L (ref 0–32)
AST: 16 [IU]/L (ref 0–40)
Albumin: 4.5 g/dL (ref 3.9–4.9)
Alkaline Phosphatase: 74 [IU]/L (ref 44–121)
BUN/Creatinine Ratio: 15 (ref 9–23)
BUN: 15 mg/dL (ref 6–24)
Bilirubin Total: 0.3 mg/dL (ref 0.0–1.2)
CO2: 23 mmol/L (ref 20–29)
Calcium: 9 mg/dL (ref 8.7–10.2)
Chloride: 101 mmol/L (ref 96–106)
Creatinine, Ser: 1.03 mg/dL — ABNORMAL HIGH (ref 0.57–1.00)
Globulin, Total: 2.8 g/dL (ref 1.5–4.5)
Glucose: 88 mg/dL (ref 70–99)
Potassium: 3.7 mmol/L (ref 3.5–5.2)
Sodium: 139 mmol/L (ref 134–144)
Total Protein: 7.3 g/dL (ref 6.0–8.5)
eGFR: 67 mL/min/{1.73_m2} (ref >59)

## 2023-06-14 ENCOUNTER — Encounter (INDEPENDENT_AMBULATORY_CARE_PROVIDER_SITE_OTHER): Payer: Self-pay | Admitting: Family Medicine

## 2023-06-14 ENCOUNTER — Ambulatory Visit (INDEPENDENT_AMBULATORY_CARE_PROVIDER_SITE_OTHER): Payer: BC Managed Care – PPO | Admitting: Family Medicine

## 2023-06-14 VITALS — BP 139/84 | HR 74 | Temp 98.2°F | Ht 63.0 in | Wt 193.0 lb

## 2023-06-14 DIAGNOSIS — Z6834 Body mass index (BMI) 34.0-34.9, adult: Secondary | ICD-10-CM | POA: Diagnosis not present

## 2023-06-14 DIAGNOSIS — E669 Obesity, unspecified: Secondary | ICD-10-CM

## 2023-06-14 DIAGNOSIS — I10 Essential (primary) hypertension: Secondary | ICD-10-CM

## 2023-06-14 NOTE — Assessment & Plan Note (Addendum)
Blood pressure well controlled today.  No chest pain, chest pressure or headache.  She is on hyzaar daily.  Continue current med at same dosage.

## 2023-06-14 NOTE — Progress Notes (Signed)
   SUBJECTIVE:  Chief Complaint: Obesity  Interim History: Patient feeling tired and frustrated with obstacles that she is experiencing trying to get healthier.  She tried an app to get get a personalized workout plan but it didn't include a warmup or cool down.  She is trying to get healthier and trying to not get so disappointed with hurdles.  Went to a friends house for Thanksgiving and celebrated with her family.  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 She hasn't been following plan strictly and just focusing on not overeating. She recognizes she hasn't been consistent with getting protein in.  Regina Jennings is here to discuss her progress with her obesity treatment plan. She is on the Category 2 Plan and states she is following her eating plan approximately 25 % of the time. She states she is exercising  5 minutes 1-2 times.   OBJECTIVE: Visit Diagnoses: Problem List Items Addressed This Visit       Cardiovascular and Mediastinum   Essential hypertension, benign - Primary   Blood pressure well controlled today.  No chest pain, chest pressure or headache.  She is on hyzaar daily.  Continue current med at same dosage.      Other Visit Diagnoses       Obesity with starting BMI of 34.0         BMI 34.0-34.9,adult           No data recorded  No data recorded  No data recorded  No data recorded    ASSESSMENT AND PLAN:  Diet: Jaidon is currently in the action stage of change. As such, her goal is to continue with weight loss efforts. She has agreed to Category 2 Plan or logging food with a goal of 1100-1200 calories and 85 or more grams of protein per day.  We discussed reading a food label today and remembering the 10:1 ratio of calories to 1g of protein for whatever food she is taking in.  Patient to start food log or journaling meal plan.  The initial goal will be to habitually log or journal for  at least 4 days a week.  The expectation it that patient may not initially meet calorie or protein goals as the nturitional understanding of food intake is begun.  We discussed the 10:1 ratio when reading a food label.  Patient agrees to keep a food log either electronically or on paper and bring to the next appointment to be able to dissect and discuss it with provider.    Exercise: Lenay has been instructed that some exercise is better than none for weight loss and overall health benefits.   Behavior Modification:  We discussed the following Behavioral Modification Strategies today: increasing lean protein intake, increasing vegetables, meal planning and cooking strategies, holiday eating strategies, and planning for success.   No follow-ups on file.Marland Kitchen She was informed of the importance of frequent follow up visits to maximize her success with intensive lifestyle modifications for her multiple health conditions.  Attestation Statements:   Reviewed by clinician on day of visit: allergies, medications, problem list, medical history, surgical history, family history, social history, and previous encounter notes.     Reuben Likes, MD

## 2023-07-21 ENCOUNTER — Ambulatory Visit (INDEPENDENT_AMBULATORY_CARE_PROVIDER_SITE_OTHER): Payer: 59 | Admitting: Family Medicine

## 2023-07-21 ENCOUNTER — Encounter (INDEPENDENT_AMBULATORY_CARE_PROVIDER_SITE_OTHER): Payer: Self-pay | Admitting: Family Medicine

## 2023-07-21 VITALS — BP 126/77 | HR 73 | Temp 98.4°F | Ht 63.0 in | Wt 192.0 lb

## 2023-07-21 DIAGNOSIS — E66811 Obesity, class 1: Secondary | ICD-10-CM

## 2023-07-21 DIAGNOSIS — R7303 Prediabetes: Secondary | ICD-10-CM

## 2023-07-21 DIAGNOSIS — Z6834 Body mass index (BMI) 34.0-34.9, adult: Secondary | ICD-10-CM | POA: Diagnosis not present

## 2023-07-21 DIAGNOSIS — E669 Obesity, unspecified: Secondary | ICD-10-CM

## 2023-07-21 NOTE — Assessment & Plan Note (Addendum)
Last A1c of 6.1 in early December.  Patient voices she is trying to stay consistent with food intake and being mindful of content of food.  She has lots of events coming up and has planned food in preparation.

## 2023-07-21 NOTE — Progress Notes (Signed)
   SUBJECTIVE:  Chief Complaint: Obesity  Interim History: Patient had a good holiday season.  She has been cooking more and spending less money.  She did track for a short period of time.  She ultimately wrote what she was eating down.  She has been noticing she is consistently inconsistent in food intake.  She wants to make a more focused effort to stay on plan even with events coming up.  Rickie is here to discuss her progress with her obesity treatment plan. She is on the Category 2 Plan and keeping a food journal and adhering to recommended goals of 1100-1200 calories and 85 grams of protein and states she is following her eating plan approximately 50 % of the time. She states she is exercising intermittantly, not consistent.   OBJECTIVE: Visit Diagnoses: Problem List Items Addressed This Visit       Other   Prediabetes - Primary   Last A1c of 6.1 in early December.  Patient voices she is trying to stay consistent with food intake and being mindful of content of food.  She has lots of events coming up and has planned food in preparation.      Other Visit Diagnoses       BMI 34.0-34.9,adult         Obesity with starting BMI of 34.0           Vitals Temp: 98.4 F (36.9 C) BP: 126/77 Pulse Rate: 73 SpO2: 100 %   Anthropometric Measurements Height: 5\' 3"  (1.6 m) Weight: 192 lb (87.1 kg) BMI (Calculated): 34.02 Weight at Last Visit: 193 lb Weight Lost Since Last Visit: 1 Weight Gained Since Last Visit: 0 Starting Weight: 192 lb Total Weight Loss (lbs): 1 lb (0.454 kg)   Body Composition  Body Fat %: 42.8 % Fat Mass (lbs): 82.2 lbs Muscle Mass (lbs): 104.4 lbs Total Body Water (lbs): 69.2 lbs Visceral Fat Rating : 10   Other Clinical Data Today's Visit #: 7 Starting Date: 01/18/23     ASSESSMENT AND PLAN:  Diet: Joslin is currently in the action stage of change. As such, her goal is to continue with weight loss efforts. She has agreed to Category 2  Plan and we discussed substitutions as well as obstacles to staying more consistently on plan. She was encouraged to reach out if she is facing any significant recurrent obstacles for [name] to navigate food substitutions or modifications to ensure nutrition and calorie intake.  Exercise: Iyonnah has been instructed that some exercise is better than none for weight loss and overall health benefits.   Behavior Modification:  We discussed the following Behavioral Modification Strategies today: increasing lean protein intake, increasing vegetables, no skipping meals, meal planning and cooking strategies, better snacking choices, planning for success, and keep a strict food journal.   No follow-ups on file.Marland Kitchen She was informed of the importance of frequent follow up visits to maximize her success with intensive lifestyle modifications for her multiple health conditions.  Attestation Statements:   Reviewed by clinician on day of visit: allergies, medications, problem list, medical history, surgical history, family history, social history, and previous encounter notes.     Reuben Likes, MD

## 2023-08-03 ENCOUNTER — Encounter: Payer: Self-pay | Admitting: Dermatology

## 2023-08-03 ENCOUNTER — Ambulatory Visit (INDEPENDENT_AMBULATORY_CARE_PROVIDER_SITE_OTHER): Payer: 59 | Admitting: Dermatology

## 2023-08-03 VITALS — BP 111/72

## 2023-08-03 DIAGNOSIS — L918 Other hypertrophic disorders of the skin: Secondary | ICD-10-CM | POA: Diagnosis not present

## 2023-08-03 NOTE — Patient Instructions (Addendum)
Hello Kennah,  Thank you for visiting [name] today.   Here is a summary of the key instructions from today's consultation:   Treatment for Skin Tags: Your skin tags will be treated using liquid nitrogen freezing. This procedure is efficient and will be conducted in our office.   Post-Treatment Care: You may experience minor irritation, akin to a cold, spicy sensation, and soreness. To mitigate this, apply Aquaphor or plain Vaseline and, if necessary, cover with a Band-Aid to protect the area from friction caused by clothing.   Potential Side Effects: There is a small possibility of temporary post-inflammatory hyperpigmentation, which may appear as a small brown spot but should gradually fade over time.  Follow-Up: We encourage you to schedule another appointment at your earliest convenience to continue with your treatment. Please note that starting in March, my availability will be limited to two days a week, so it is advisable to book your next appointment soon.  General Health: It is important to continue managing your prediabetes as discussed with your family doctor. Proper management can help in preventing the formation of new skin tags and other related symptoms.  We are looking forward to your next visit to progress with your treatment. Should you have any questions or concerns before then, please do not hesitate to contact our office.  Warm regards,  Dr. Langston [facility]  Important Information  Due to recent changes in healthcare laws, you may see results of your pathology and/or laboratory studies on MyChart before the doctors have had a chance to review them. We understand that in some cases there may be results that are confusing or concerning to you. Please understand that not all results are received at the same time and often the doctors may need to interpret multiple results in order to provide you with the best plan of care or course of treatment. Therefore, we ask that  you please give [name] 2 business days to thoroughly review all your results before contacting the office for clarification. Should we see a critical lab result, you will be contacted sooner.   If You Need Anything After Your Visit  If you have any questions or concerns for your doctor, please call our main line at [phone] If no one answers, please leave a voicemail as directed and we will return your call as soon as possible. Messages left after 4 pm will be answered the following business day.   You may also send [name] a message via MyChart. We typically respond to MyChart messages within 1-2 business days.  For prescription refills, please ask your pharmacy to contact our office. Our fax number is [phone].  If you have an urgent issue when the clinic is closed that cannot wait until the next business day, you can page your doctor at the number below.    Please note that while we do our best to be available for urgent issues outside of office hours, we are not available 24/7.   If you have an urgent issue and are unable to reach [name], you may choose to seek medical care at your doctor's office, retail clinic, urgent care center, or emergency room.  If you have a medical emergency, please immediately call 911 or go to the emergency department. In the event of inclement weather, please call our main line at [phone] for an update on the status of any delays or closures.  Dermatology Medication Tips: Please keep the boxes that topical medications come in in order to help  keep track of the instructions about where and how to use these. Pharmacies typically print the medication instructions only on the boxes and not directly on the medication tubes.   If your medication is too expensive, please contact our office at [phone] or send [name] a message through MyChart.   We are unable to tell what your co-pay for medications will be in advance as this is different depending on your insurance  coverage. However, we may be able to find a substitute medication at lower cost or fill out paperwork to get insurance to cover a needed medication.   If a prior authorization is required to get your medication covered by your insurance company, please allow [name] 1-2 business days to complete this process.  Drug prices often vary depending on where the prescription is filled and some pharmacies may offer cheaper prices.  The website www.goodrx.com contains coupons for medications through different pharmacies. The prices here do not account for what the cost may be with help from insurance (it may be cheaper with your insurance), but the website can give you the price if you did not use any insurance.  - You can print the associated coupon and take it with your prescription to the pharmacy.  - You may also stop by our office during regular business hours and pick up a GoodRx coupon card.  - If you need your prescription sent electronically to a different pharmacy, notify our office through [facility] or by phone at [phone]

## 2023-08-03 NOTE — Progress Notes (Signed)
   New Patient Visit   Subjective  Regina Jennings is a 48 y.o. female who presents for the following: New Pt - Skin tags  Patient states she has skin tags located at the scattered and neck that she would like to have examined. Patient reports the areas have been there for an unknown amount of years. She reports the areas are bothersome.Patient rates irritation 7 out of 10. She states that the areas have not spread. Patient reports she has previously been treated for these areas by PCP and was referred to dermatology. Patient denies Hx of bx. Patient denies family history of skin cancer(s).  The patient has spots, moles and lesions to be evaluated, some may be new or changing and the patient may have concern these could be cancer.   The following portions of the chart were reviewed this encounter and updated as appropriate: medications, allergies, medical history  Review of Systems:  No other skin or systemic complaints except as noted in HPI or Assessment and Plan.  Objective  Well appearing patient in no apparent distress; mood and affect are within normal limits.   A focused examination was performed of the following areas: neck, under arm, elbow crease   Relevant exam findings are noted in the Assessment and Plan.    Assessment & Plan   SKIN TAGS (Acrochordons) Assessment: Patient presents with multiple skin tags, primarily located on the neck area, which have been present for a couple of years with new ones appearing over time. Noted that skin tags can be a sign of insulin resistance, potentially appearing even before laboratory work indicates prediabetes. The patient has previously been identified as prediabetic based on lab work.  Plan:   Cryotherapy treatment for the removal of 4 bothersome skin tags deferred to a future appointment.   Schedule follow-up appointment at the patient's convenience for cryotherapy.    Post-procedure care instructions:     Apply Aquaphor  healing ointment or plain Vaseline to treated areas.     Use Band-Aids as needed, especially under clothing.   Advised patient about potential temporary side effects:     Stinging sensation during the procedure.     Possible post-inflammatory hyperpigmentation.    Discussed the link between skin tags and insulin resistance/prediabetes.    Reviewed pt's labwork and recent HbgA1c was 6.1 on 06/06/23, trending up from 5.8 a few months prior    Recommended follow-up with primary care physician regarding prediabetes management plan (diet, exercise).    Return cryo of inflamed skin tags & ok for double book or 12.45pm slot per JD.    Documentation: I have reviewed the above documentation for accuracy and completeness, and I agree with the above.  I, Shirron Marcha Solders, CMA, am acting as scribe for [employer], DO.   Langston Reusing, DO

## 2023-08-16 ENCOUNTER — Ambulatory Visit: Payer: 59 | Admitting: Dermatology

## 2023-08-17 ENCOUNTER — Ambulatory Visit (INDEPENDENT_AMBULATORY_CARE_PROVIDER_SITE_OTHER): Payer: 59 | Admitting: Family Medicine

## 2023-08-18 ENCOUNTER — Encounter (INDEPENDENT_AMBULATORY_CARE_PROVIDER_SITE_OTHER): Payer: Self-pay | Admitting: Family Medicine

## 2023-08-18 ENCOUNTER — Ambulatory Visit (INDEPENDENT_AMBULATORY_CARE_PROVIDER_SITE_OTHER): Payer: 59 | Admitting: Family Medicine

## 2023-08-18 VITALS — BP 113/70 | HR 73 | Temp 98.7°F | Ht 63.0 in | Wt 195.0 lb

## 2023-08-18 DIAGNOSIS — E669 Obesity, unspecified: Secondary | ICD-10-CM | POA: Diagnosis not present

## 2023-08-18 DIAGNOSIS — E785 Hyperlipidemia, unspecified: Secondary | ICD-10-CM | POA: Diagnosis not present

## 2023-08-18 DIAGNOSIS — R7303 Prediabetes: Secondary | ICD-10-CM | POA: Diagnosis not present

## 2023-08-18 DIAGNOSIS — Z6834 Body mass index (BMI) 34.0-34.9, adult: Secondary | ICD-10-CM | POA: Diagnosis not present

## 2023-08-18 DIAGNOSIS — E7800 Pure hypercholesterolemia, unspecified: Secondary | ICD-10-CM

## 2023-08-18 NOTE — Progress Notes (Signed)
   SUBJECTIVE:  Chief Complaint: Obesity  Interim History: Over the last month patient has been completing a bunch of tasks over the last month.  She has done work and non work over the last month.  She has not been following the meal plan at all because she didn't even try.  She took her daughter on as an additional food expense.  She bought what they bought could eat. 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  Franci is here to discuss her progress with her obesity treatment plan. She is on the Category 2 Plan and states she is not following her eating plan approximately 0 % of the time. She states she is not exercising 0 minutes 0 times per week.   OBJECTIVE: Visit Diagnoses: Problem List Items Addressed This Visit       Other   Prediabetes - Primary   A1c of 6.1 in December.  She has been financially strapped the last month.  She is planning to recommit in the spring to the meal plan.      HLD (hyperlipidemia)   Last LDL slightly elevated.  She is not on medication.  She realizes at this time she isn't the best mindset to commit to program completely.  Will take time away and plan on coming back to the program in May of 2025.       Vitals Temp: 98.7 F (37.1 C) BP: 113/70 Pulse Rate: 73 SpO2: 98 %   Anthropometric Measurements Height: 5\' 3"  (1.6 m) Weight: 195 lb (88.5 kg) BMI (Calculated): 34.55 Weight at Last Visit: 192lb Weight Lost Since Last Visit: 0lb Weight Gained Since Last Visit: 3lb Starting Weight: 192lb Total Weight Loss (lbs): 0 lb (0 kg)   Body Composition  Body Fat %: 44.3 % Fat Mass (lbs): 86.4 lbs Muscle Mass (lbs): 103 lbs Total Body Water (lbs): 71.6 lbs Visceral Fat Rating : 11   Other Clinical Data Today's Visit #: 8 Starting Date: 01/18/23     ASSESSMENT AND PLAN:  Diet: Marthella is not currently in the action stage of change. As such, her goal is to continue with weight  loss efforts. She has agreed to practicing portion control and making smarter food choices, such as increasing vegetables and decreasing simple carbohydrates.  Exercise: Brighid has been instructed that some exercise is better than none for weight loss and overall health benefits.   Behavior Modification:  We discussed the following Behavioral Modification Strategies today: increasing lean protein intake, decreasing simple carbohydrates, increasing vegetables, and increase H2O intake.   No follow-ups on file.Marland Kitchen She was informed of the importance of frequent follow up visits to maximize her success with intensive lifestyle modifications for her multiple health conditions.  Attestation Statements:   Reviewed by clinician on day of visit: allergies, medications, problem list, medical history, surgical history, family history, social history, and previous encounter notes.      Rudean Hitt, MD

## 2023-08-18 NOTE — Assessment & Plan Note (Signed)
A1c of 6.1 in December.  She has been financially strapped the last month.  She is planning to recommit in the spring to the meal plan.

## 2023-08-18 NOTE — Assessment & Plan Note (Signed)
Last LDL slightly elevated.  She is not on medication.  She realizes at this time she isn't the best mindset to commit to program completely.  Will take time away and plan on coming back to the program in May of 2025.

## 2023-09-06 ENCOUNTER — Other Ambulatory Visit: Payer: Self-pay

## 2023-09-06 ENCOUNTER — Other Ambulatory Visit: Payer: Self-pay | Admitting: Obstetrics and Gynecology

## 2023-09-06 DIAGNOSIS — Z1231 Encounter for screening mammogram for malignant neoplasm of breast: Secondary | ICD-10-CM

## 2023-09-06 DIAGNOSIS — Z789 Other specified health status: Secondary | ICD-10-CM

## 2023-09-07 LAB — MEASLES/MUMPS/RUBELLA IMMUNITY
MUMPS ABS, IGG: 13.5 [AU]/ml (ref >10.9)
RUBEOLA AB, IGG: >300 [AU]/ml (ref >16.4)
Rubella Antibodies, IGG: 23.9 {index} (ref >0.99)

## 2023-09-16 ENCOUNTER — Ambulatory Visit
Admission: RE | Admit: 2023-09-16 | Discharge: 2023-09-16 | Disposition: A | Discharge status: Home / Self Care | Source: Ambulatory Visit | Attending: Obstetrics and Gynecology | Admitting: Obstetrics and Gynecology

## 2023-09-16 DIAGNOSIS — Z1231 Encounter for screening mammogram for malignant neoplasm of breast: Secondary | ICD-10-CM

## 2023-09-27 ENCOUNTER — Other Ambulatory Visit: Payer: Self-pay | Admitting: Internal Medicine

## 2023-10-25 ENCOUNTER — Ambulatory Visit: Payer: 59 | Admitting: Dermatology

## 2023-11-22 ENCOUNTER — Ambulatory Visit (INDEPENDENT_AMBULATORY_CARE_PROVIDER_SITE_OTHER): Payer: 59 | Admitting: Family Medicine

## 2023-12-13 NOTE — Progress Notes (Unsigned)
 [place], CMA,acting as a scribe for Smiley Dung, MD.,have documented all relevant documentation on the behalf of Smiley Dung, MD,as directed by  Smiley Dung, MD while in the presence of Smiley Dung, MD.  Subjective:    Patient ID: Regina Jennings , female    DOB: [birth year] , 48 y.o.   MRN: [id number]  No chief complaint on file.   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   Hypertension    Prediabetes    Vitamin D  deficiency      Family History  Problem Relation Age of Onset   Cancer Mother    Migraines Mother    Ulcers Mother    Hypertension Mother    Hyperlipidemia Mother    Irritable bowel syndrome Mother    GER disease Mother    Breast cancer Mother        58's   Stroke Father    Hypertension Maternal Grandmother    Stroke Maternal Grandmother    Congestive Heart Failure Maternal Grandmother    Breast cancer Paternal Grandmother    Sleep apnea Neg Hx      Current Outpatient Medications:    buPROPion  (WELLBUTRIN  XL) 150 MG 24 hr tablet, Take 1 tablet (150 mg total) by mouth every morning., Disp: 30 tablet, Rfl: 2   Cholecalciferol (VITAMIN D ) 50 MCG (2000 UT) CAPS, Take 2,000 Units by mouth See admin instructions. Takes 2,000 units every day, Disp: , Rfl:    losartan -hydrochlorothiazide  (HYZAAR) 50-12.5 MG tablet, TAKE 1 TABLET BY MOUTH DAILY, Disp: 90 tablet, Rfl: 2   MAGNESIUM GLYCINATE PO, Take by mouth as needed., Disp: , Rfl:    meloxicam  (MOBIC ) 7.5 MG tablet, One tab po daily as needed, Disp: 30 tablet, Rfl: 2   norethindrone  (ORTHO MICRONOR ) 0.35 MG tablet, Take 1 tablet (0.35 mg total) by mouth daily., Disp: 28 tablet, Rfl: 11   nystatin  cream (MYCOSTATIN ), Apply 1 application topically 2 (two) times daily., Disp: 30 g, Rfl: 0   OPZELURA 1.5 % CREA, Apply 1 application  topically 2 (two) times daily., Disp: , Rfl:    pimecrolimus (ELIDEL) 1 % cream, , Disp: , Rfl:     triamcinolone cream (KENALOG) 0.1 %, Apply topically 2 (two) times daily as needed., Disp: , Rfl:    Allergies  Allergen Reactions   Boric Acid Hives   Flagyl [Metronidazole]    Sulfa Antibiotics Rash   Xyzal [Cetirizine Hcl] Rash      The patient states she uses {contraceptive methods:5051} for birth control. No LMP recorded. (Menstrual status: Oral contraceptives).. {Dysmenorrhea-menorrhagia:21918}. Negative for: breast discharge, breast lump(s), breast pain and breast self exam. Associated symptoms include abnormal vaginal bleeding. Pertinent negatives include abnormal bleeding (hematology), anxiety, decreased libido, depression, difficulty falling sleep, dyspareunia, history of infertility, nocturia, sexual dysfunction, sleep disturbances, urinary incontinence, urinary urgency, vaginal discharge and vaginal itching. Diet regular.The patient states her exercise level is    . The patient's tobacco use is:  Social History   Tobacco Use  Smoking Status Never  Smokeless Tobacco Never  . She has been exposed to passive smoke. The patient's alcohol use is:  Social History   Substance and Sexual Activity  Alcohol Use Yes   Comment: seldom, maybe twice a year  . Additional information: Last pap ***, next one scheduled for ***.    Review of Systems   There  were no vitals filed for this visit. There is no height or weight on file to calculate BMI.  Wt Readings from Last 3 Encounters:  08/18/23 195 lb (88.5 kg)  07/21/23 192 lb (87.1 kg)  06/14/23 193 lb (87.5 kg)     Objective:  Physical Exam      Assessment And Plan:     Encounter for annual health examination  Essential hypertension, benign  Prediabetes     No follow-ups on file. Patient was given opportunity to ask questions. Patient verbalized understanding of the plan and was able to repeat key elements of the plan. All questions were answered to their satisfaction.   Smiley Dung, MD  I, Smiley Dung,  MD, have reviewed all documentation for this visit. The documentation on 12/13/23 for the exam, diagnosis, procedures, and orders are all accurate and complete.

## 2023-12-14 ENCOUNTER — Ambulatory Visit (INDEPENDENT_AMBULATORY_CARE_PROVIDER_SITE_OTHER): Payer: Self-pay | Admitting: Internal Medicine

## 2023-12-14 ENCOUNTER — Encounter: Payer: Self-pay | Admitting: Internal Medicine

## 2023-12-14 ENCOUNTER — Ambulatory Visit: Payer: Self-pay | Admitting: Internal Medicine

## 2023-12-14 VITALS — BP 132/84 | HR 70 | Temp 98.6°F | Ht 63.0 in | Wt 201.2 lb

## 2023-12-14 DIAGNOSIS — G478 Other sleep disorders: Secondary | ICD-10-CM | POA: Diagnosis not present

## 2023-12-14 DIAGNOSIS — Z8249 Family history of ischemic heart disease and other diseases of the circulatory system: Secondary | ICD-10-CM

## 2023-12-14 DIAGNOSIS — I10 Essential (primary) hypertension: Secondary | ICD-10-CM | POA: Diagnosis not present

## 2023-12-14 DIAGNOSIS — R7303 Prediabetes: Secondary | ICD-10-CM | POA: Diagnosis not present

## 2023-12-14 DIAGNOSIS — Z0000 Encounter for general adult medical examination without abnormal findings: Secondary | ICD-10-CM

## 2023-12-14 DIAGNOSIS — Z803 Family history of malignant neoplasm of breast: Secondary | ICD-10-CM

## 2023-12-14 LAB — POCT URINALYSIS DIP (CLINITEK)
Bilirubin, UA: NEGATIVE
Blood, UA: NEGATIVE
Glucose, UA: NEGATIVE mg/dL
Ketones, POC UA: NEGATIVE mg/dL
Leukocytes, UA: NEGATIVE
Nitrite, UA: NEGATIVE
POC PROTEIN,UA: NEGATIVE
Spec Grav, UA: 1.02 (ref 1.010–1.025)
Urobilinogen, UA: 0.2 U/dL
pH, UA: 6.5 (ref 5.0–8.0)

## 2023-12-14 NOTE — Assessment & Plan Note (Addendum)
 A full exam was performed.  Importance of monthly self breast exams was discussed with the patient.  She is advised to get 30-45 minutes of regular exercise, no less than four to five days per week. Both weight-bearing and aerobic exercises are recommended.  She is advised to follow a healthy diet with at least six fruits/veggies per day, decrease intake of red meat and other saturated fats and to increase fish intake to twice weekly.  Meats/fish should not be fried -- baked, boiled or broiled is preferable. It is also important to cut back on your sugar intake.  Be sure to read labels - try to avoid anything with added sugar, high fructose corn syrup or other sweeteners.  If you must use a sweetener, you can try stevia or monkfruit.  It is also important to avoid artificially sweetened foods/beverages and diet drinks. Lastly, wear SPF 50 sunscreen on exposed skin and when in direct sunlight for an extended period of time.  Be sure to avoid fast food restaurants and aim for at least 60 ounces of water daily.    - Schedule mammogram at the breast center. - Request Pap smear records from Dr. Hermann Look. - Encourage regular self-breast exams post-menstruation.

## 2023-12-14 NOTE — Assessment & Plan Note (Signed)
 Reports fatigue and nocturnal breathing difficulties. Previous home test done; desires in-center study. - Refer for in-center sleep study.

## 2023-12-14 NOTE — Assessment & Plan Note (Signed)
 Family history of stroke. Discussed genetic predisposition and potential need for further cardiac evaluation if lipoprotein(a) test is positive. - Order lipoprotein(a) test. - Consider cardiac calcium score if lipoprotein(a) test is positive. - Discuss heart-healthy lifestyle changes including exercise and diet.

## 2023-12-14 NOTE — Assessment & Plan Note (Addendum)
 Chronic, fair control. Goal BP<120/80.  EKG performed, NSR w/o acute changes.  Blood pressure not at goal. Recent sleep disturbance may have affected reading. Emphasized regular monitoring and lifestyle modifications.She will continue with losartan /hct 50/12.5mg  daily. No med changes today. Pt encouraged to incorporate more exercise into her daily routine. She will rto in six months for re-evaluation.

## 2023-12-14 NOTE — Assessment & Plan Note (Signed)
 Previous labs reviewed, her A1c has been elevated in the past. I will check an A1c today. Reminded to avoid refined sugars including sugary drinks/foods and processed meats including bacon, sausages and deli meats.

## 2023-12-14 NOTE — Patient Instructions (Signed)
 Coronary Calcium Scan----$99 A coronary calcium scan is an imaging test used to look for deposits of plaque in the inner lining of the blood vessels of the heart (coronary arteries). Plaque is made up of calcium, protein, and fatty substances. These deposits of plaque can partly clog and narrow the coronary arteries without producing any symptoms or warning signs. This puts a person at risk for a heart attack. A coronary calcium scan is performed using a computed tomography (CT) scanner machine without using a dye (contrast). This test is recommended for people who are at moderate risk for heart disease. The test can find plaque deposits before symptoms develop. Tell a health care provider about: Any allergies you have. All medicines you are taking, including vitamins, herbs, eye drops, creams, and over-the-counter medicines. Any problems you or family members have had with anesthetic medicines. Any bleeding problems you have. Any surgeries you have had. Any medical conditions you have. Whether you are pregnant or may be pregnant. What are the risks? Generally, this is a safe procedure. However, problems may occur, including: Harm to a pregnant woman and her unborn baby. This test involves the use of radiation. Radiation exposure can be dangerous to a pregnant woman and her unborn baby. If you are pregnant or think you may be pregnant, you should not have this procedure done. A slight increase in the risk of cancer. This is because of the radiation involved in the test. The amount of radiation from one test is similar to the amount of radiation you are naturally exposed to over one year. What happens before the procedure? Ask your health care provider for any specific instructions on how to prepare for this procedure. You may be asked to avoid products that contain caffeine, tobacco, or nicotine for 4 hours before the procedure. What happens during the procedure?  You will undress and remove any  jewelry from your neck or chest. You may need to remove hearing aides and dentures. Women may need to remove their bras. You will put on a hospital gown. Sticky electrodes will be placed on your chest. The electrodes will be connected to an electrocardiogram (ECG) machine to record a tracing of the electrical activity of your heart. You will lie down on your back on a curved bed that is attached to the CT scanner. You may be given medicine to slow down your heart rate so that clear pictures can be created. You will be moved into the CT scanner, and the CT scanner will take pictures of your heart. During this time, you will be asked to lie still and hold your breath for 10-20 seconds at a time while each picture of your heart is being taken. The procedure may vary among health care providers and hospitals. What can I expect after the procedure? You can return to your normal activities. It is up to you to get the results of your procedure. Ask your health care provider, or the department that is doing the procedure, when your results will be ready. Summary A coronary calcium scan is an imaging test used to look for deposits of plaque in the inner lining of the blood vessels of the heart. Plaque is made up of calcium, protein, and fatty substances. A coronary calcium scan is performed using a CT scanner machine without contrast. Generally, this is a safe procedure. Tell your health care provider if you are pregnant or may be pregnant. Ask your health care provider for any specific instructions on how to  prepare for this procedure. You can return to your normal activities after the scan is done. This information is not intended to replace advice given to you by your health care provider. Make sure you discuss any questions you have with your health care provider. Document Revised: 05/31/2021 Document Reviewed: 05/31/2021 Elsevier Patient Education  2024 [employer].

## 2023-12-15 LAB — CBC WITH DIFFERENTIAL/PLATELET
Basophils Absolute: 0 10*3/uL (ref 0.0–0.2)
Basos: 0 %
EOS (ABSOLUTE): 0.1 10*3/uL (ref 0.0–0.4)
Eos: 2 %
Hematocrit: 39.5 % (ref 34.0–46.6)
Hemoglobin: 12.7 g/dL (ref 11.1–15.9)
Immature Grans (Abs): 0 10*3/uL (ref 0.0–0.1)
Immature Granulocytes: 0 %
Lymphocytes Absolute: 3 10*3/uL (ref 0.7–3.1)
Lymphs: 43 %
MCH: 26.1 pg — ABNORMAL LOW (ref 26.6–33.0)
MCHC: 32.2 g/dL (ref 31.5–35.7)
MCV: 81 fL (ref 79–97)
Monocytes Absolute: 0.6 10*3/uL (ref 0.1–0.9)
Monocytes: 9 %
Neutrophils Absolute: 3.1 10*3/uL (ref 1.4–7.0)
Neutrophils: 46 %
Platelets: 263 10*3/uL (ref 150–450)
RBC: 4.86 x10E6/uL (ref 3.77–5.28)
RDW: 13.9 % (ref 11.7–15.4)
WBC: 6.8 10*3/uL (ref 3.4–10.8)

## 2023-12-15 LAB — LIPID PANEL
Chol/HDL Ratio: 2.6 ratio (ref 0.0–4.4)
Cholesterol, Total: 196 mg/dL (ref 100–199)
HDL: 75 mg/dL (ref >39)
LDL Chol Calc (NIH): 110 mg/dL — ABNORMAL HIGH (ref 0–99)
Triglycerides: 61 mg/dL (ref 0–149)
VLDL Cholesterol Cal: 11 mg/dL (ref 5–40)

## 2023-12-15 LAB — CMP14+EGFR
ALT: 17 IU/L (ref 0–32)
AST: 21 IU/L (ref 0–40)
Albumin: 4.4 g/dL (ref 3.9–4.9)
Alkaline Phosphatase: 66 IU/L (ref 44–121)
BUN/Creatinine Ratio: 14 (ref 9–23)
BUN: 13 mg/dL (ref 6–24)
Bilirubin Total: 0.5 mg/dL (ref 0.0–1.2)
CO2: 21 mmol/L (ref 20–29)
Calcium: 9.4 mg/dL (ref 8.7–10.2)
Chloride: 100 mmol/L (ref 96–106)
Creatinine, Ser: 0.9 mg/dL (ref 0.57–1.00)
Globulin, Total: 2.5 g/dL (ref 1.5–4.5)
Glucose: 82 mg/dL (ref 70–99)
Potassium: 3.9 mmol/L (ref 3.5–5.2)
Sodium: 138 mmol/L (ref 134–144)
Total Protein: 6.9 g/dL (ref 6.0–8.5)
eGFR: 79 mL/min/{1.73_m2} (ref >59)

## 2023-12-15 LAB — HEMOGLOBIN A1C
Est. average glucose Bld gHb Est-mCnc: 123 mg/dL
Hgb A1c MFr Bld: 5.9 % — ABNORMAL HIGH (ref 4.8–5.6)

## 2023-12-15 LAB — MICROALBUMIN / CREATININE URINE RATIO
Creatinine, Urine: 81.7 mg/dL
Microalb/Creat Ratio: <4 mg/g{creat} (ref 0–29)
Microalbumin, Urine: <3 ug/mL

## 2023-12-15 LAB — LIPOPROTEIN A (LPA): Lipoprotein (a): 41.1 nmol/L (ref <75.0)

## 2023-12-27 ENCOUNTER — Ambulatory Visit

## 2024-02-02 ENCOUNTER — Encounter: Payer: Self-pay | Admitting: Family Medicine

## 2024-02-02 ENCOUNTER — Ambulatory Visit: Payer: Self-pay | Admitting: Family Medicine

## 2024-02-02 ENCOUNTER — Encounter: Payer: Self-pay | Admitting: Internal Medicine

## 2024-02-02 VITALS — BP 110/80 | HR 110 | Temp 103.0°F | Ht 63.0 in | Wt 201.0 lb

## 2024-02-02 DIAGNOSIS — R197 Diarrhea, unspecified: Secondary | ICD-10-CM

## 2024-02-02 DIAGNOSIS — R509 Fever, unspecified: Secondary | ICD-10-CM

## 2024-02-02 DIAGNOSIS — R109 Unspecified abdominal pain: Secondary | ICD-10-CM

## 2024-02-02 DIAGNOSIS — R1110 Vomiting, unspecified: Secondary | ICD-10-CM | POA: Diagnosis not present

## 2024-02-02 LAB — POCT INFLUENZA A/B
Influenza A, POC: NEGATIVE
Influenza B, POC: NEGATIVE

## 2024-02-02 LAB — POC COVID19 BINAXNOW: SARS Coronavirus 2 Ag: NEGATIVE

## 2024-02-02 MED ORDER — AZITHROMYCIN 250 MG PO TABS: ORAL_TABLET | ORAL | 0 refills | Status: AC

## 2024-02-02 NOTE — Progress Notes (Signed)
 I,Jameka J Llittleton, CMA,acting as a Neurosurgeon for [employer], NP.,have documented all relevant documentation on the behalf of Bruna Creighton, NP,as directed by  Bruna Creighton, NP while in the presence of Bruna Creighton, NP.  Subjective:  Patient ID: Regina Jennings , female    DOB: July 19, 1975 , 48 y.o.   MRN: [id number]  Chief Complaint  Patient presents with   URI    Patient presents today for cold symptoms. She reports she has been having chills, diarrhea, upset stomach, no appetite, fatigue.     HPI Discussed the use of AI scribe software for clinical note transcription with the patient, who gave verbal consent to proceed.  History of Present Illness     Regina Jennings is a 48 year old female who presents with fever and gastrointestinal symptoms.  She began experiencing fever on Monday evening, accompanied by chills, headache, body pain, and sweats. She also has a dry mouth with a bad taste and a lack of appetite, as everything tastes unusual.  She has been unable to eat properly for several days and feels dehydrated due to nausea, which limits her fluid intake. She vomits once daily since Tuesday and has had diarrhea twice daily on Tuesday and Wednesday. She feels extremely fatigued and lacks energy.  No cough is present. She reports stomach pain and fever. Her stomach growls at night, preventing her from sleeping, as she cannot satisfy her hunger due to her inability to eat.  She recalls eating at a restaurant on Sunday, which was the last meal she did not prepare herself, and began feeling unwell the following day. She has not tried Gatorade but has consumed Pedialyte to manage her dehydration.  She describes the stomach pain as generalized but particularly tender around the area where her bra sits. She denies any history of ulcers. Her neck hurts due to lack of proper rest at night.      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   Hypertension    Prediabetes    Vitamin D deficiency      Family History  Problem Relation Age of Onset   Cancer Mother    Migraines Mother    Ulcers Mother    Hypertension Mother    Hyperlipidemia Mother    Irritable bowel syndrome Mother    GER disease Mother    Breast cancer Mother        60 's   Stroke Father    Hypertension Maternal Grandmother    Stroke Maternal Grandmother    Congestive Heart Failure Maternal Grandmother    Breast cancer Paternal Grandmother    Sleep apnea Neg Hx      Current Outpatient Medications:    buPROPion  (WELLBUTRIN  XL) 150 MG 24 hr tablet, Take 1 tablet (150 mg total) by mouth every morning., Disp: 30 tablet, Rfl: 2   Cholecalciferol (VITAMIN D ) 50 MCG (2000 UT) CAPS, Take 2,000 Units by mouth See admin instructions. Takes 2,000 units every day, Disp: , Rfl:    losartan -hydrochlorothiazide  (HYZAAR) 50-12.5 MG tablet, TAKE 1 TABLET BY MOUTH DAILY, Disp: 90 tablet, Rfl: 2   MAGNESIUM GLYCINATE PO, Take by mouth as needed., Disp: , Rfl:    meloxicam  (MOBIC ) 7.5 MG tablet, One tab po daily as needed, Disp: 30 tablet, Rfl: 2   norethindrone  (ORTHO MICRONOR ) 0.35 MG tablet, Take 1 tablet (0.35 mg total) by mouth daily., Disp: 28 tablet, Rfl: 11  nystatin  cream (MYCOSTATIN ), Apply 1 application topically 2 (two) times daily., Disp: 30 g, Rfl: 0   OPZELURA 1.5 % CREA, Apply 1 application  topically 2 (two) times daily., Disp: , Rfl:    pimecrolimus (ELIDEL) 1 % cream, , Disp: , Rfl:    triamcinolone cream (KENALOG) 0.1 %, Apply topically 2 (two) times daily as needed., Disp: , Rfl:    amoxicillin -clavulanate (AUGMENTIN ) 875-125 MG tablet, Take 1 tablet by mouth every 12 (twelve) hours., Disp: 14 tablet, Rfl: 0   pantoprazole  (PROTONIX ) 40 MG tablet, Take 1 tablet (40 mg total) by mouth daily., Disp: 30 tablet, Rfl: 1   Allergies  Allergen Reactions   Boric Acid Hives   Flagyl [Metronidazole]    Sulfa Antibiotics Rash   Xyzal [Cetirizine  Hcl] Rash     Review of Systems  Constitutional:  Positive for appetite change, chills, fatigue and fever.  HENT: Negative.  Negative for congestion and sinus pain.   Respiratory:  Negative for cough.   Cardiovascular: Negative.   Gastrointestinal:  Positive for abdominal pain, diarrhea, nausea and vomiting. Negative for blood in stool.  Genitourinary: Negative.   Neurological: Negative.      Today's Vitals   02/02/24 1035  BP: 110/80  Pulse: (!) 110  Temp: (!) 103 F (39.4 C)  TempSrc: Oral  Weight: 201 lb (91.2 kg)  Height: 5' 3 (1.6 m)  PainSc: 0-No pain   Body mass index is 35.61 kg/m.  Wt Readings from Last 3 Encounters:  02/05/24 201 lb (91.2 kg)  02/02/24 201 lb (91.2 kg)  12/14/23 201 lb 3.2 oz (91.3 kg)    The 10-year ASCVD risk score (Arnett DK, et al., 2019) is: 1.5%   Values used to calculate the score:     Age: 29 years     Clincally relevant sex: Female     Is Non-Hispanic African American: Yes     Diabetic: No     Tobacco smoker: No     Systolic Blood Pressure: 123 mmHg     Is BP treated: Yes     HDL Cholesterol: 75 mg/dL     Total Cholesterol: 196 mg/dL  Objective:  Physical Exam HENT:     Head: Normocephalic.  Cardiovascular:     Rate and Rhythm: Tachycardia present.  Abdominal:     Tenderness: There is abdominal tenderness. There is no guarding or rebound.  Neurological:     Mental Status: She is alert.         Assessment And Plan:  Abdominal pain, vomiting, and diarrhea Assessment & Plan: Acute gastroenteritis likely due to food poisoning, with poor intake. Negative for COVID-19 and flu. Abdominal tenderness noted. Differential includes food poisoning.  - Recommended over-the-counter probiotic Restora.  Orders: -     Azithromycin ; Take 2 tablets (500 mg) on  Day 1,  followed by 1 tablet (250 mg) once daily on Days 2 through 5.  Dispense: 6 each; Refill: 0  Fever and chills Assessment & Plan: - Suggested Tylenol  for fever  management.  Orders: -     POC COVID-19 BinaxNow -     POCT Influenza A/B   Assessment & Plan Acute gastroenteritis with dehydration  - Prescribed azithromycin : 2 tablets today, then 1 tablet daily for 4 days. - Advised oral rehydration with Gatorade or vitamin water. - Recommended gradual dietary progression from liquids to semi-solids and solids, starting with chicken broth, applesauce, bananas, and crackers.      Return if symptoms worsen or  fail to improve, for Keep appt.  Patient was given opportunity to ask questions. Patient verbalized understanding of the plan and was able to repeat key elements of the plan. All questions were answered to their satisfaction.    I, Bruna Creighton, NP, have reviewed all documentation for this visit. The documentation on 02/07/2024 for the exam, diagnosis, procedures, and orders are all accurate and complete.    IF YOU HAVE BEEN REFERRED TO A SPECIALIST, IT MAY TAKE 1-2 WEEKS TO SCHEDULE/PROCESS THE REFERRAL. IF YOU HAVE NOT HEARD FROM US /SPECIALIST IN TWO WEEKS, PLEASE GIVE US  A CALL AT [phone].

## 2024-02-05 ENCOUNTER — Emergency Department (HOSPITAL_COMMUNITY)

## 2024-02-05 ENCOUNTER — Emergency Department (HOSPITAL_COMMUNITY): Admission: EM | Admit: 2024-02-05 | Discharge: 2024-02-05 | Disposition: A | Discharge status: Home / Self Care

## 2024-02-05 ENCOUNTER — Encounter (HOSPITAL_COMMUNITY): Payer: Self-pay | Admitting: *Deleted

## 2024-02-05 ENCOUNTER — Other Ambulatory Visit: Payer: Self-pay

## 2024-02-05 DIAGNOSIS — E876 Hypokalemia: Secondary | ICD-10-CM | POA: Diagnosis not present

## 2024-02-05 DIAGNOSIS — K2970 Gastritis, unspecified, without bleeding: Secondary | ICD-10-CM | POA: Diagnosis not present

## 2024-02-05 DIAGNOSIS — J168 Pneumonia due to other specified infectious organisms: Secondary | ICD-10-CM | POA: Insufficient documentation

## 2024-02-05 DIAGNOSIS — N179 Acute kidney failure, unspecified: Secondary | ICD-10-CM | POA: Insufficient documentation

## 2024-02-05 DIAGNOSIS — J189 Pneumonia, unspecified organism: Secondary | ICD-10-CM

## 2024-02-05 DIAGNOSIS — R112 Nausea with vomiting, unspecified: Secondary | ICD-10-CM | POA: Diagnosis present

## 2024-02-05 LAB — COMPREHENSIVE METABOLIC PANEL WITH GFR
ALT: 40 U/L (ref 0–44)
AST: 38 U/L (ref 15–41)
Albumin: 3.6 g/dL (ref 3.5–5.0)
Alkaline Phosphatase: 54 U/L (ref 38–126)
Anion gap: 14 (ref 5–15)
BUN: 33 mg/dL — ABNORMAL HIGH (ref 6–20)
CO2: 27 mmol/L (ref 22–32)
Calcium: 9.2 mg/dL (ref 8.9–10.3)
Chloride: 96 mmol/L — ABNORMAL LOW (ref 98–111)
Creatinine, Ser: 1.32 mg/dL — ABNORMAL HIGH (ref 0.44–1.00)
GFR, Estimated: 50 mL/min — ABNORMAL LOW (ref >60)
Glucose, Bld: 120 mg/dL — ABNORMAL HIGH (ref 70–99)
Potassium: 3.1 mmol/L — ABNORMAL LOW (ref 3.5–5.1)
Sodium: 137 mmol/L (ref 135–145)
Total Bilirubin: 0.6 mg/dL (ref 0.0–1.2)
Total Protein: 8.7 g/dL — ABNORMAL HIGH (ref 6.5–8.1)

## 2024-02-05 LAB — URINALYSIS, ROUTINE W REFLEX MICROSCOPIC
Bacteria, UA: NONE SEEN
Bilirubin Urine: NEGATIVE
Glucose, UA: NEGATIVE mg/dL
Ketones, ur: 20 mg/dL — AB
Leukocytes,Ua: NEGATIVE
Nitrite: NEGATIVE
Protein, ur: NEGATIVE mg/dL
Specific Gravity, Urine: >1.046 — ABNORMAL HIGH (ref 1.005–1.030)
pH: 5 (ref 5.0–8.0)

## 2024-02-05 LAB — CBC WITH DIFFERENTIAL/PLATELET
Abs Immature Granulocytes: 0.06 K/uL (ref 0.00–0.07)
Basophils Absolute: 0.1 K/uL (ref 0.0–0.1)
Basophils Relative: 1 %
Eosinophils Absolute: 0.2 K/uL (ref 0.0–0.5)
Eosinophils Relative: 3 %
HCT: 41.5 % (ref 36.0–46.0)
Hemoglobin: 13.4 g/dL (ref 12.0–15.0)
Immature Granulocytes: 1 %
Lymphocytes Relative: 26 %
Lymphs Abs: 1.8 K/uL (ref 0.7–4.0)
MCH: 24.6 pg — ABNORMAL LOW (ref 26.0–34.0)
MCHC: 32.3 g/dL (ref 30.0–36.0)
MCV: 76.3 fL — ABNORMAL LOW (ref 80.0–100.0)
Monocytes Absolute: 0.8 K/uL (ref 0.1–1.0)
Monocytes Relative: 11 %
Neutro Abs: 4.2 K/uL (ref 1.7–7.7)
Neutrophils Relative %: 58 %
Platelets: 367 K/uL (ref 150–400)
RBC: 5.44 MIL/uL — ABNORMAL HIGH (ref 3.87–5.11)
RDW: 15.1 % (ref 11.5–15.5)
WBC: 7.1 K/uL (ref 4.0–10.5)
nRBC: 0 % (ref 0.0–0.2)

## 2024-02-05 LAB — HCG, SERUM, QUALITATIVE: Preg, Serum: NEGATIVE

## 2024-02-05 LAB — LIPASE, BLOOD: Lipase: 60 U/L — ABNORMAL HIGH (ref 11–51)

## 2024-02-05 MED ORDER — POTASSIUM CHLORIDE CRYS ER 20 MEQ PO TBCR
40.0000 meq | EXTENDED_RELEASE_TABLET | Freq: Once | ORAL | Status: AC
  Administered 2024-02-05: 40 meq via ORAL
  Filled 2024-02-05: qty 2

## 2024-02-05 MED ORDER — ONDANSETRON HCL 4 MG/2ML IJ SOLN
4.0000 mg | Freq: Once | INTRAMUSCULAR | Status: AC
  Administered 2024-02-05: 4 mg via INTRAVENOUS
  Filled 2024-02-05: qty 2

## 2024-02-05 MED ORDER — FAMOTIDINE IN NACL 20-0.9 MG/50ML-% IV SOLN
20.0000 mg | Freq: Once | INTRAVENOUS | Status: AC
  Administered 2024-02-05: 20 mg via INTRAVENOUS
  Filled 2024-02-05: qty 50

## 2024-02-05 MED ORDER — SODIUM CHLORIDE 0.9 % IV BOLUS
1000.0000 mL | Freq: Once | INTRAVENOUS | Status: AC
  Administered 2024-02-05: 1000 mL via INTRAVENOUS

## 2024-02-05 MED ORDER — IOHEXOL 300 MG/ML  SOLN
100.0000 mL | Freq: Once | INTRAMUSCULAR | Status: AC | PRN
  Administered 2024-02-05: 100 mL via INTRAVENOUS

## 2024-02-05 MED ORDER — AMOXICILLIN-POT CLAVULANATE 875-125 MG PO TABS: 1.0000 | ORAL_TABLET | Freq: Two times a day (BID) | ORAL | 0 refills | Status: AC

## 2024-02-05 MED ORDER — POTASSIUM CHLORIDE 10 MEQ/100ML IV SOLN
10.0000 meq | Freq: Once | INTRAVENOUS | Status: AC
  Administered 2024-02-05: 10 meq via INTRAVENOUS
  Filled 2024-02-05: qty 100

## 2024-02-05 MED ORDER — PANTOPRAZOLE SODIUM 40 MG PO TBEC: 40.0000 mg | DELAYED_RELEASE_TABLET | Freq: Every day | ORAL | 1 refills | Status: AC

## 2024-02-05 NOTE — ED Provider Notes (Signed)
 [place] [facility] AT [facility] Provider Note   CSN: [id number] Arrival date & time: 02/05/24  1000     Patient presents with: Abdominal Pain   Regina Jennings is a 48 y.o. female.    Abdominal Pain   Presents because of abdominal pain.  Patient states he started becoming nauseous on Monday.  Subsequently started having nausea vomiting diarrhea starting Tuesday.  Diarrhea is subsequently resolved but continues to have multiple episodes of emesis.  She is endorsing some epigastric pain with a burning sensation.  Also endorsing her taste in her mouth.  She states that she feels like anytime she eats or drinks anything she subsequently has episodes of emesis.  No previous abdominal surgeries.  Takes no medication.  She states that she went to see her PCP who started on azithromycin  because of concern for food poisoning.  No hematemesis.  No melena.     Prior to Admission medications   Medication Sig Start Date End Date Taking? Authorizing Provider  amoxicillin -clavulanate (AUGMENTIN ) 875-125 MG tablet Take 1 tablet by mouth every 12 (twelve) hours. 02/05/24  Yes Simon Lavonia SAILOR, MD  pantoprazole  (PROTONIX ) 40 MG tablet Take 1 tablet (40 mg total) by mouth daily. 02/05/24  Yes Simon Lavonia SAILOR, MD  azithromycin  (ZITHROMAX ) 250 MG tablet Take 2 tablets (500 mg) on  Day 1,  followed by 1 tablet (250 mg) once daily on Days 2 through 5. 02/02/24 02/07/24  Petrina Pries, NP  buPROPion  (WELLBUTRIN  XL) 150 MG 24 hr tablet Take 1 tablet (150 mg total) by mouth every morning. 10/14/22 02/02/24  Moore, Janece, FNP  Cholecalciferol (VITAMIN D ) 50 MCG (2000 UT) CAPS Take 2,000 Units by mouth See admin instructions. Takes 2,000 units every day    [provider]  losartan -hydrochlorothiazide  (HYZAAR) 50-12.5 MG tablet TAKE 1 TABLET BY MOUTH DAILY 09/27/23 09/26/24  Jarold Medici, MD  MAGNESIUM GLYCINATE PO Take by mouth as needed.    [provider]  meloxicam  (MOBIC ) 7.5  MG tablet One tab po daily as needed 02/03/23   Jarold Medici, MD  norethindrone  (ORTHO MICRONOR ) 0.35 MG tablet Take 1 tablet (0.35 mg total) by mouth daily. 11/17/21 02/02/24  Jarold Medici, MD  nystatin  cream (MYCOSTATIN ) Apply 1 application topically 2 (two) times daily. 03/24/21   Jarold Medici, MD  OPZELURA 1.5 % CREA Apply 1 application  topically 2 (two) times daily. 07/10/21   [provider]  pimecrolimus (ELIDEL) 1 % cream  03/27/21   [provider]  triamcinolone cream (KENALOG) 0.1 % Apply topically 2 (two) times daily as needed. 03/27/21   [provider]    Allergies: Boric acid, Flagyl [metronidazole], Sulfa antibiotics, and Xyzal [cetirizine hcl]    Review of Systems  Gastrointestinal:  Positive for abdominal pain.    Updated Vital Signs BP 125/89 (BP Location: Right Arm)   Pulse 74   Temp 98 F (36.7 C) (Oral)   Resp 18   Wt 91.2 kg   SpO2 98%   BMI 35.61 kg/m   Physical Exam Vitals and nursing note reviewed.  Constitutional:      General: She is not in acute distress.    Appearance: She is well-developed.  HENT:     Head: Normocephalic and atraumatic.  Eyes:     Conjunctiva/sclera: Conjunctivae normal.  Cardiovascular:     Rate and Rhythm: Normal rate and regular rhythm.     Heart sounds: No murmur heard. Pulmonary:     Effort: Pulmonary  effort is normal. No respiratory distress.     Breath sounds: Normal breath sounds.  Abdominal:     Palpations: Abdomen is soft.     Tenderness: There is no abdominal tenderness.   Musculoskeletal:        General: No swelling.     Cervical back: Neck supple.  Skin:    General: Skin is warm and dry.     Capillary Refill: Capillary refill takes less than 2 seconds.  Neurological:     Mental Status: She is alert.  Psychiatric:        Mood and Affect: Mood normal.     (all labs ordered are listed, but only abnormal results are displayed) Labs Reviewed  LIPASE, BLOOD - Abnormal; Notable  for the following components:      Result Value   Lipase 60 (*)    All other components within normal limits  COMPREHENSIVE METABOLIC PANEL WITH GFR - Abnormal; Notable for the following components:   Potassium 3.1 (*)    Chloride 96 (*)    Glucose, Bld 120 (*)    BUN 33 (*)    Creatinine, Ser 1.32 (*)    Total Protein 8.7 (*)    GFR, Estimated 50 (*)    All other components within normal limits  URINALYSIS, ROUTINE W REFLEX MICROSCOPIC - Abnormal; Notable for the following components:   Color, Urine STRAW (*)    Specific Gravity, Urine >1.046 (*)    Hgb urine dipstick MODERATE (*)    Ketones, ur 20 (*)    All other components within normal limits  CBC WITH DIFFERENTIAL/PLATELET - Abnormal; Notable for the following components:   RBC 5.44 (*)    MCV 76.3 (*)    MCH 24.6 (*)    All other components within normal limits  HCG, SERUM, QUALITATIVE    EKG: None  Radiology: DG Chest Portable 1 View Result Date: 02/05/2024 CLINICAL DATA:  Concern for pneumonia. Airspace consolidation noted on CT. EXAM: PORTABLE CHEST 1 VIEW COMPARISON:  CT abdomen/pelvis dated 02/05/2024. Chest radiograph dated 01/10/2013. FINDINGS: The heart size and mediastinal contours are within normal limits. Hazy right basilar airspace opacity, corresponding to consolidation within the posteromedial right lower lobe noted on the same-day CT abdomen/pelvis. The remainder of the lung fields appear grossly clear. No pleural effusion or pneumothorax. No acute osseous abnormality. IMPRESSION: Hazy right basilar airspace opacity, corresponding to consolidation within the right lower lobe noted on the same-day CT abdomen/pelvis. These findings are concerning for pneumonia and/or aspiration. Electronically Signed   By: Harrietta Sherry M.D.   On: 02/05/2024 14:39   CT ABDOMEN PELVIS W CONTRAST Result Date: 02/05/2024 EXAM: CT ABDOMEN AND PELVIS WITH CONTRAST 02/05/2024 12:26:01 PM TECHNIQUE: CT of the abdomen and pelvis was  performed with the administration of iohexol  (OMNIPAQUE ) 300 MG/ML solution. Multiplanar reformatted images are provided for review. Automated exposure control, iterative reconstruction, and/or weight based adjustment of the mA/kV was utilized to reduce the radiation dose to as low as reasonably achievable. COMPARISON: 01/01/2010 CLINICAL HISTORY: Epigastric pain. RUQ pain. N/V. eval for cholecystitis or obvious duodenitis. Table formatting from the original note was not included. Notes from triage: Here by POV from home for abd pain and NVD. Seen by provider previously for the same. Sx onset Monday, seen on Thursday and diagnosed with food poisoning. Prescribed meds including antibiotic. Pt of [facility]. Alert, NAD, calm, interactive, resps e/u, speaking in clear complete sentences. Vx1 today. Last diarrhea x1 on Wednesday. Rates  pain 6/10. FINDINGS: LOWER CHEST: There is airspace consolidation and ground-glass attenuation within the posterior and medial right lower lobe, concerning for pneumonia and/or aspiration. Areas of subsegmental atelectasis noted in the posterior left base. LIVER: There are 2 small less than 1 cm low-density lesions within segment 4a and the caudate lobe which are technically too small to characterize but likely represent small cysts. GALLBLADDER AND BILE DUCTS: Equivocal tiny stone within the gallbladder fundus measuring 3 mm. No gallbladder wall thickening or pericholecystic fluid. No bile duct dilatation. SPLEEN: No acute abnormality. PANCREAS: No acute abnormality. ADRENAL GLANDS: No acute abnormality. KIDNEYS, URETERS AND BLADDER: Mild bilateral renal cortical scarring. No stones in the kidneys or ureters. No hydronephrosis. No perinephric or periureteral stranding. Urinary bladder is unremarkable. GI AND BOWEL: There is wall thickening involving the Anteropyloris with wall thickness measuring up to 7 mm. No surrounding inflammatory soft tissue stranding. The appendix is  visualized and appears normal. PERITONEUM AND RETROPERITONEUM: No ascites. No free air. VASCULATURE: Aorta is normal in caliber. LYMPH NODES: No signs of abdominal or pelvic adenopathy. REPRODUCTIVE ORGANS: Cystic structure within the right adnexa appears somewhat elongated and tubular measuring 3.9 x 1.2 x 2.7 cm, axial image 61.5, coronal image 99.7. BONES AND SOFT TISSUES: No acute osseous abnormality. No focal soft tissue abnormality. IMPRESSION: 1. Airspace consolidation and ground-glass attenuation within the posterior and medial right lower lobe, concerning for pneumonia and/or aspiration. 2. Wall thickening involving the anteropyloris with wall thickness measuring up to 7 mm. No surrounding inflammatory soft tissue stranding. Correlate for any clinical signs or symptoms suggestive of gastritis or duodenitis. 3. Equivocal tiny stone within the gallbladder fundus measuring 3 mm. No gallbladder wall thickening or pericholecystic fluid. No bile duct dilatation. Electronically signed by: Waddell Calk MD 02/05/2024 12:38 PM EDT RP Workstation: [id number]     Procedures   Medications Ordered in the ED  famotidine  (PEPCID ) IVPB 20 mg premix (0 mg Intravenous Stopped 02/05/24 1313)  sodium chloride  0.9 % bolus 1,000 mL (0 mLs Intravenous Stopped 02/05/24 1312)  ondansetron  (ZOFRAN ) injection 4 mg (4 mg Intravenous Given 02/05/24 1156)  sodium chloride  0.9 % bolus 1,000 mL (0 mLs Intravenous Stopped 02/05/24 1458)  potassium chloride  10 mEq in 100 mL IVPB (0 mEq Intravenous Stopped 02/05/24 1313)  iohexol  (OMNIPAQUE ) 300 MG/ML solution 100 mL (100 mLs Intravenous Contrast Given 02/05/24 1216)  potassium chloride  SA (KLOR-CON  M) CR tablet 40 mEq (40 mEq Oral Given 02/05/24 1457)                                    Medical Decision Making Amount and/or Complexity of Data Reviewed Labs: ordered. Radiology: ordered.  Risk Prescription drug management.    Presents because of abdominal pain.  Patient states  he started becoming nauseous on Monday.  Subsequently started having nausea vomiting diarrhea starting Tuesday.  Diarrhea is subsequently resolved but continues to have multiple episodes of emesis.  She is endorsing some epigastric pain with a burning sensation.  Also endorsing her taste in her mouth.  She states that she feels like anytime she eats or drinks anything she subsequently has episodes of emesis.  No previous abdominal surgeries.  Takes no medication.  She states that she went to see her PCP who started on azithromycin  because of concern for food poisoning.  No hematemesis.  No melena.   On exam, patient hemodynamically stable.  ANO x 3 GCS  15.  No focal deficits.  Maps appropriate.  Afebrile.   Some slight pain to palpation epigastric/right upper quad area.  No significant rebound or guarding.   Laboratory workup.  Patient found to be slightly hypokalemic at 3.1.  Was given both p.o. and IV potassium.  Slight AKI at 1.32.  Patient was given 2 L of fluid here in the ED.  Likely prerenal given the vomiting.   Small elevation of lipase.  T. bili normal.  LFTs normal.  Did obtain CT scan of the abdomen.  Findings consistent for gastritis.  Consistent with patient's presentation and symptoms as well as history.  Started patient on 40 mg PPI daily.  Will follow-up with gastroenterology outpatient.  I feel like the slightly elevated lipase is likely reactive in nature.  No evidence of pancreatitis on CT scan.  Patient also noted to have airspace consolidation in the posterior right medial and lower lobe.  Patient has been vomiting while here recently.  Question whether or not this could be aspiration related.  She states she has a mild cough.  Nonproductive.  No fever no chills.  She is already been on azithromycin  because her PCP prescribed due to concern for some form of foodborne illness.  Given the atypical bacteria document cover, we will add on Augmentin  for her to take daily as well.   Remained hemodynamically stable.  On room air.  Afebrile.  Normotensive.   Will follow-up with gastroenterology for likely upper GI.       Final diagnoses:  Gastritis without bleeding, unspecified chronicity, unspecified gastritis type  Pneumonia due to infectious organism, unspecified laterality, unspecified part of lung  Hypokalemia    ED Discharge Orders          Ordered    pantoprazole  (PROTONIX ) 40 MG tablet  Daily        02/05/24 1406    amoxicillin -clavulanate (AUGMENTIN ) 875-125 MG tablet  Every 12 hours        02/05/24 1406               Simon Lavonia SAILOR, MD 02/05/24 1514

## 2024-02-05 NOTE — ED Triage Notes (Signed)
 Here by POV from home for abd pain and NVD. Seen by provider previously for the same. Sx onset Monday, seen on Thursday and diagnosed with food poisoning. Prescribed meds including antibiotic. Pt of [facility]. Alert, NAD, calm, interactive, resps e/u, speaking in clear complete sentences. Vx1 today. Last diarrhea x1 on Wednesday. Rates pain 6/10.

## 2024-02-05 NOTE — Discharge Instructions (Signed)
 Will cover you with antibiotics.  Continue azithromycin .  Start taking your Augmentin .  Follow-up with your PCP.  We have any, worsening fevers or shortness of breath please come to the ED for further evaluation.   I will start in a PPI as well.  Please take this as prescribed.  Please follow-up with gastroenterology.  They will likely want to perform a upper GI on you in the future.  If you have any kind of blood in your stool or vomit any blood then please come at the ED for further evaluation.

## 2024-02-08 DIAGNOSIS — R509 Fever, unspecified: Secondary | ICD-10-CM | POA: Insufficient documentation

## 2024-02-08 DIAGNOSIS — R109 Unspecified abdominal pain: Secondary | ICD-10-CM | POA: Insufficient documentation

## 2024-02-08 NOTE — Assessment & Plan Note (Signed)
-   Suggested Tylenol  for fever management.

## 2024-02-08 NOTE — Assessment & Plan Note (Addendum)
 Acute gastroenteritis likely due to food poisoning, with poor intake. Negative for COVID-19 and flu. Abdominal tenderness noted. Differential includes food poisoning.  - Recommended over-the-counter probiotic Restora.

## 2024-02-09 ENCOUNTER — Ambulatory Visit: Admitting: Internal Medicine

## 2024-02-09 ENCOUNTER — Encounter: Payer: Self-pay | Admitting: Internal Medicine

## 2024-02-09 VITALS — BP 110/80 | HR 94 | Temp 97.8°F | Ht 63.0 in | Wt 191.4 lb

## 2024-02-09 DIAGNOSIS — E66811 Obesity, class 1: Secondary | ICD-10-CM

## 2024-02-09 DIAGNOSIS — K219 Gastro-esophageal reflux disease without esophagitis: Secondary | ICD-10-CM

## 2024-02-09 DIAGNOSIS — E6609 Other obesity due to excess calories: Secondary | ICD-10-CM

## 2024-02-09 DIAGNOSIS — E876 Hypokalemia: Secondary | ICD-10-CM

## 2024-02-09 DIAGNOSIS — K2900 Acute gastritis without bleeding: Secondary | ICD-10-CM

## 2024-02-09 DIAGNOSIS — J189 Pneumonia, unspecified organism: Secondary | ICD-10-CM

## 2024-02-09 DIAGNOSIS — K2970 Gastritis, unspecified, without bleeding: Secondary | ICD-10-CM

## 2024-02-09 DIAGNOSIS — Z6833 Body mass index (BMI) 33.0-33.9, adult: Secondary | ICD-10-CM

## 2024-02-09 DIAGNOSIS — I10 Essential (primary) hypertension: Secondary | ICD-10-CM

## 2024-02-09 NOTE — Patient Instructions (Signed)
 Gastritis, Adult Gastritis is irritation and swelling (inflammation) of the stomach. There are two kinds of gastritis: Acute gastritis. This kind develops quickly. Chronic gastritis. This kind is much more common. It develops slowly and lasts for a long time. It is important to get help for this condition. If you do not get help, your stomach can bleed, and you can get sores (ulcers) in your stomach. What are the causes? This condition may be caused by: Germs that get to your stomach and cause an infection. Drinking too much alcohol. Medicines you are taking. Having too much acid in the stomach. Having a disease of the stomach. Other causes may include: An allergic reaction. Some cancer treatments (radiation). Smoking cigarettes or using products that contain nicotine or tobacco. In some cases, the cause of this condition is not known. What increases the risk? Having a disease of the intestines. Having Crohn's disease. Using aspirin or ibuprofen and other NSAIDs to treat other conditions. Stress. What are the signs or symptoms? Pain in your stomach. A burning feeling in your stomach. Feeling like you may vomit (nauseous). Vomiting or vomiting blood. Feeling too full after you eat. Weight loss. Bad breath. Blood in your poop (stool). In some cases, there are no symptoms. How is this treated? This condition is treated with medicines. The medicines that are used depend on what caused the condition. You may be given: Antibiotic medicine, if your condition was caused by an infection from germs. H2 blockers and similar medicines, if your condition was caused by too much acid in the stomach. Treatment may also include stopping the use of certain medicines, such as aspirin or ibuprofen. Follow these instructions at home: Medicines Take over-the-counter and prescription medicines only as told by your doctor. If you were prescribed an antibiotic medicine, take it as told by your doctor.  Do not stop taking it even if you start to feel better. Alcohol use Do not drink alcohol if: Your doctor tells you not to drink. You are pregnant, may be pregnant, or are planning to become pregnant. If you drink alcohol: Limit your use to: 0-1 drink a day for women. 0-2 drinks a day for men. Know how much alcohol is in your drink. In the [place] one drink equals one 12 oz bottle of beer (355 mL), one 5 oz glass of wine (148 mL), or one 1 oz glass of hard liquor (44 mL). General instructions  Eat small meals often, instead of large meals. Avoid foods and drinks that make you feel worse. Drink enough fluid to keep your pee (urine) pale yellow. Talk with your doctor about ways to manage stress. You can exercise or do deep breathing, meditation, or yoga. Do not smoke or use any products that contain nicotine or tobacco. If you need help quitting, ask your doctor. Keep all follow-up visits. Contact a doctor if: Your symptoms get worse. Your stomach pain gets worse. Your symptoms go away and then come back. You have a fever. Get help right away if: You vomit blood or something that looks like coffee grounds. You have black or dark red poop. You throw up any time you try to drink fluids. These symptoms may be an emergency. Get help right away. Call your local emergency services (911 in the [place] Do not wait to see if the symptoms will go away. Do not drive yourself to the hospital. Summary Gastritis is irritation and swelling (inflammation) of the stomach. You must get help for this condition. If you do  not get help, your stomach can bleed, and you can get sores (ulcers) in your stomach. You can be treated with medicines for germs or medicines to block too much acid in your stomach. This information is not intended to replace advice given to you by your health care provider. Make sure you discuss any questions you have with your health care provider. Document Revised: 10/25/2020 Document  Reviewed: 10/25/2020 Elsevier Patient Education  2024 [employer].

## 2024-02-09 NOTE — Progress Notes (Signed)
 I,Regina Jennings, CMA,acting as a Neurosurgeon for Regina LOISE Slocumb, MD.,have documented all relevant documentation on the behalf of Regina LOISE Slocumb, MD,as directed by  Regina LOISE Slocumb, MD while in the presence of Regina LOISE Slocumb, MD.  Subjective:  Patient ID: Regina Jennings , female    DOB: [birth year] , 48 y.o.   MRN: [id number]  Chief Complaint  Patient presents with   ER Follow Up     Patient presents today for ER follow up. She went to [facility] on 08/03 on "Sunday for abdominal pain. Today she states feeling alright, she still feels uneasy not 100%.  She wants to know what should she stay away from / what she can eat due to her poor appetite.  She also has noticed loose stools. Today she noticed her stool appeared  black. Denies noticing any blood in her stools.     HPI Discussed the use of AI scribe software for clinical note transcription with the patient, who gave verbal consent to proceed.  History of Present Illness Regina Jennings is a 48 year old female who presents with persistent gastrointestinal symptoms following an ER visit for nausea, vomiting, and diarrhea.  She began experiencing nausea, vomiting, and diarrhea on the evening of Monday, January 30, 2024. Her symptoms worsened, leading to an ER visit on Sunday, February 05, 2024, where she was diagnosed with gastritis and pneumonia. She was prescribed a Z-Pak, Augmentin, and Protonix. Since then, vomiting has ceased, but she continues to experience a persistent bad taste in her mouth and a sensitive stomach. She describes her stomach as constantly 'growling' without feeling hungry. Despite following a bland diet, many foods, including water, taste unpleasant.  She was tested for COVID-19, which was negative. She has been taking Protonix once daily and also takes Augmentin. She reports a weight loss of ten pounds since the onset of her symptoms.  She developed a cough starting on Thursday, February 09, 2024. During her ER visit, she  was found to be severely dehydrated and required IV fluids. She continues to focus on hydration, consuming watermelon and other hydrating foods.  She notes that her stool was black, which she attributes to taking Pepto Bismol once the previous week. She has not taken any since. She reports tenderness in her abdomen, particularly when pressure is applied.    Hypertension This is a chronic problem. The current episode started more than 1 year ago. The problem has been gradually improving since onset. The problem is controlled. Pertinent negatives include no blurred vision, chest pain, palpitations or shortness of breath. Risk factors for coronary artery disease include sedentary lifestyle and obesity. Past treatments include angiotensin blockers.     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   Hypertension    Prediabetes    Vitamin D deficiency      Family History  Problem Relation Age of Onset   Cancer Mother    Migraines Mother    Ulcers Mother    Hypertension Mother    Hyperlipidemia Mother    Irritable bowel syndrome Mother    GER disease Mother    Breast cancer Mother        60" 's   Stroke Father    Hypertension Maternal Grandmother    Stroke Maternal Grandmother    Congestive Heart Failure Maternal Grandmother    Breast cancer Paternal Grandmother    Sleep apnea Neg Hx  Current Outpatient Medications:    amoxicillin -clavulanate (AUGMENTIN ) 875-125 MG tablet, Take 1 tablet by mouth every 12 (twelve) hours., Disp: 14 tablet, Rfl: 0   buPROPion  (WELLBUTRIN  XL) 150 MG 24 hr tablet, Take 1 tablet (150 mg total) by mouth every morning., Disp: 30 tablet, Rfl: 2   Cholecalciferol (VITAMIN D ) 50 MCG (2000 UT) CAPS, Take 2,000 Units by mouth See admin instructions. Takes 2,000 units every day, Disp: , Rfl:    losartan -hydrochlorothiazide  (HYZAAR) 50-12.5 MG tablet, TAKE 1 TABLET BY MOUTH DAILY, Disp: 90 tablet, Rfl: 2    MAGNESIUM GLYCINATE PO, Take by mouth as needed., Disp: , Rfl:    meloxicam  (MOBIC ) 7.5 MG tablet, One tab po daily as needed, Disp: 30 tablet, Rfl: 2   norethindrone  (ORTHO MICRONOR ) 0.35 MG tablet, Take 1 tablet (0.35 mg total) by mouth daily., Disp: 28 tablet, Rfl: 11   nystatin  cream (MYCOSTATIN ), Apply 1 application topically 2 (two) times daily., Disp: 30 g, Rfl: 0   OPZELURA 1.5 % CREA, Apply 1 application  topically 2 (two) times daily., Disp: , Rfl:    pantoprazole  (PROTONIX ) 40 MG tablet, Take 1 tablet (40 mg total) by mouth daily., Disp: 30 tablet, Rfl: 1   pimecrolimus (ELIDEL) 1 % cream, , Disp: , Rfl:    triamcinolone cream (KENALOG) 0.1 %, Apply topically 2 (two) times daily as needed., Disp: , Rfl:    Allergies  Allergen Reactions   Boric Acid Hives   Flagyl [Metronidazole]    Sulfa Antibiotics Rash   Xyzal [Cetirizine Hcl] Rash     Review of Systems  Constitutional: Negative.   HENT: Negative.    Eyes:  Negative for blurred vision.  Respiratory: Negative.  Negative for shortness of breath.   Cardiovascular: Negative.  Negative for chest pain and palpitations.  Gastrointestinal:  Positive for abdominal pain and nausea.  Neurological: Negative.   Psychiatric/Behavioral: Negative.       Today's Vitals   02/09/24 1605  BP: 110/80  Pulse: 94  Temp: 97.8 F (36.6 C)  SpO2: 98%  Weight: 191 lb 6.4 oz (86.8 kg)  Height: 5' 3 (1.6 m)   Body mass index is 33.9 kg/m.  Wt Readings from Last 3 Encounters:  02/09/24 191 lb 6.4 oz (86.8 kg)  02/05/24 201 lb (91.2 kg)  02/02/24 201 lb (91.2 kg)     Objective:  Physical Exam Vitals and nursing note reviewed.  Constitutional:      Appearance: Normal appearance. She is obese.  HENT:     Head: Normocephalic and atraumatic.  Eyes:     Extraocular Movements: Extraocular movements intact.  Cardiovascular:     Rate and Rhythm: Normal rate and regular rhythm.     Heart sounds: Normal heart sounds.  Pulmonary:      Effort: Pulmonary effort is normal.     Breath sounds: Normal breath sounds.  Abdominal:     General: Bowel sounds are normal.     Palpations: Abdomen is soft.     Tenderness: There is abdominal tenderness.  Musculoskeletal:     Cervical back: Normal range of motion.  Skin:    General: Skin is warm.  Neurological:     General: No focal deficit present.     Mental Status: She is alert.  Psychiatric:        Mood and Affect: Mood normal.        Behavior: Behavior normal.       Assessment And Plan:  Acute gastritis without hemorrhage, unspecified  gastritis type Assessment & Plan: Recent symptoms and black stools suggest possible upper GI bleed. - Refer to gastroenterologist at [facility] for further evaluation. - Provide stool cards for home testing to check for blood in stool. - Instruct to take Protonix  (pantoprazole ) twice daily before breakfast and dinner. - Advise to avoid NSAIDs and vitamin C until stool testing is complete. - Instruct to follow a bland diet and avoid roughage until symptoms improve  Orders: -     Ambulatory referral to Gastroenterology -     BMP8+EGFR; Future  Pneumonia due to infectious organism, unspecified laterality, unspecified part of lung Assessment & Plan: Recent diagnosis with symptoms of cough and fever. Currently on Augmentin  for treatment.  Orders: -     ABO/Rh; Future  Hypokalemia Assessment & Plan: Recent episode due to gastroenteritis with severe dehydration. Potassium levels need re-evaluation. - Recheck potassium and kidney function with blood work. - Encourage hydration with water and hydrating foods like watermelonRecent episode due to gastroenteritis with severe dehydration. Potassium levels need re-evaluation. - Recheck potassium and kidney function with blood work. - Encourage hydration with water and hydrating foods like watermelon  Orders: -     BMP8+EGFR; Future  Gastroesophageal reflux disease without esophagitis Assessment  & Plan: GERD with dysgeusia, including a persistent metallic taste. Symptoms may be exacerbated by recent gastroenteritis and dietary changes. - Instruct to take Protonix  (pantoprazole ) twice daily before breakfast and dinner. - Advise to avoid eating 3 hours before lying down. - Recommend avoiding acidic foods and beverages, such as pineapple juice and tomatoes. - Suggest trying Pepcid  for a week to see if symptoms improve.   Essential hypertension, benign Assessment & Plan: Chronic, fair control. Goal BP<130/80.  She will continue with losartan /hydrochlorothiazide  50/12.5mg  daily.  - Follow low sodium diet   Class 1 obesity due to excess calories with serious comorbidity and body mass index (BMI) of 33.0 to 33.9 in adult Assessment & Plan: She is encouraged to strive for BMI less than 30 to decrease cardiac risk. Advised to aim for at least 150 minutes of exercise per week.     Return in 5 days (on 02/14/2024), or lab visit.  Patient was given opportunity to ask questions. Patient verbalized understanding of the plan and was able to repeat key elements of the plan. All questions were answered to their satisfaction.   I, Regina LOISE Slocumb, MD, have reviewed all documentation for this visit. The documentation on 02/09/24 for the exam, diagnosis, procedures, and orders are all accurate and complete.   IF YOU HAVE BEEN REFERRED TO A SPECIALIST, IT MAY TAKE 1-2 WEEKS TO SCHEDULE/PROCESS THE REFERRAL. IF YOU HAVE NOT HEARD FROM US /SPECIALIST IN TWO WEEKS, PLEASE GIVE US  A CALL AT [phone].   THE PATIENT IS ENCOURAGED TO PRACTICE SOCIAL DISTANCING DUE TO THE COVID-19 PANDEMIC.

## 2024-02-14 ENCOUNTER — Other Ambulatory Visit

## 2024-02-14 DIAGNOSIS — E876 Hypokalemia: Secondary | ICD-10-CM

## 2024-02-14 DIAGNOSIS — K2970 Gastritis, unspecified, without bleeding: Secondary | ICD-10-CM

## 2024-02-14 DIAGNOSIS — J189 Pneumonia, unspecified organism: Secondary | ICD-10-CM

## 2024-02-15 ENCOUNTER — Ambulatory Visit: Payer: Self-pay | Admitting: Internal Medicine

## 2024-02-15 ENCOUNTER — Other Ambulatory Visit: Payer: Self-pay

## 2024-02-15 DIAGNOSIS — K2970 Gastritis, unspecified, without bleeding: Secondary | ICD-10-CM

## 2024-02-15 LAB — HEMOCCULT GUIAC POC 1CARD (OFFICE)
Card #2 Fecal Occult Blod, POC: NEGATIVE
Card #3 Fecal Occult Blood, POC: NEGATIVE
Fecal Occult Blood, POC: NEGATIVE

## 2024-02-15 LAB — BMP8+EGFR
BUN/Creatinine Ratio: 10 (ref 9–23)
BUN: 10 mg/dL (ref 6–24)
CO2: 24 mmol/L (ref 20–29)
Calcium: 9.6 mg/dL (ref 8.7–10.2)
Chloride: 101 mmol/L (ref 96–106)
Creatinine, Ser: 0.96 mg/dL (ref 0.57–1.00)
Glucose: 98 mg/dL (ref 70–99)
Potassium: 4.3 mmol/L (ref 3.5–5.2)
Sodium: 140 mmol/L (ref 134–144)
eGFR: 73 mL/min/1.73 (ref >59)

## 2024-02-15 LAB — ABO/RH: Rh Factor: POSITIVE

## 2024-02-15 NOTE — Progress Notes (Unsigned)
 I,Valeta Paz T Emmitt, CMA,acting as a Neurosurgeon for [place], CMA.,have documented all relevant documentation on the behalf of Richerd ONEIDA Emmitt, [place] directed by  Richerd ONEIDA Emmitt, CMA while in the presence of Richerd ONEIDA Emmitt, [place].  Subjective:  Patient ID: Regina Jennings , female    DOB: 1976/01/29 , 48 y.o.   MRN: [id number]  No chief complaint on file.   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   Hypertension    Prediabetes    Vitamin D  deficiency      Family History  Problem Relation Age of Onset   Cancer Mother    Migraines Mother    Ulcers Mother    Hypertension Mother    Hyperlipidemia Mother    Irritable bowel syndrome Mother    GER disease Mother    Breast cancer Mother        53's   Stroke Father    Hypertension Maternal Grandmother    Stroke Maternal Grandmother    Congestive Heart Failure Maternal Grandmother    Breast cancer Paternal Grandmother    Sleep apnea Neg Hx      Current Outpatient Medications:    amoxicillin -clavulanate (AUGMENTIN ) 875-125 MG tablet, Take 1 tablet by mouth every 12 (twelve) hours., Disp: 14 tablet, Rfl: 0   buPROPion  (WELLBUTRIN  XL) 150 MG 24 hr tablet, Take 1 tablet (150 mg total) by mouth every morning., Disp: 30 tablet, Rfl: 2   Cholecalciferol (VITAMIN D ) 50 MCG (2000 UT) CAPS, Take 2,000 Units by mouth See admin instructions. Takes 2,000 units every day, Disp: , Rfl:    losartan -hydrochlorothiazide  (HYZAAR) 50-12.5 MG tablet, TAKE 1 TABLET BY MOUTH DAILY, Disp: 90 tablet, Rfl: 2   MAGNESIUM GLYCINATE PO, Take by mouth as needed., Disp: , Rfl:    meloxicam  (MOBIC ) 7.5 MG tablet, One tab po daily as needed, Disp: 30 tablet, Rfl: 2   norethindrone  (ORTHO MICRONOR ) 0.35 MG tablet, Take 1 tablet (0.35 mg total) by mouth daily., Disp: 28 tablet, Rfl: 11   nystatin  cream (MYCOSTATIN ), Apply 1 application topically 2 (two) times daily., Disp: 30  g, Rfl: 0   OPZELURA 1.5 % CREA, Apply 1 application  topically 2 (two) times daily., Disp: , Rfl:    pantoprazole  (PROTONIX ) 40 MG tablet, Take 1 tablet (40 mg total) by mouth daily., Disp: 30 tablet, Rfl: 1   pimecrolimus (ELIDEL) 1 % cream, , Disp: , Rfl:    triamcinolone cream (KENALOG) 0.1 %, Apply topically 2 (two) times daily as needed., Disp: , Rfl:    Allergies  Allergen Reactions   Boric Acid Hives   Flagyl [Metronidazole]    Sulfa Antibiotics Rash   Xyzal [Cetirizine Hcl] Rash     Review of Systems   There were no vitals filed for this visit. There is no height or weight on file to calculate BMI.  Wt Readings from Last 3 Encounters:  02/09/24 191 lb 6.4 oz (86.8 kg)  02/05/24 201 lb (91.2 kg)  02/02/24 201 lb (91.2 kg)     Objective:  Physical Exam      Assessment And Plan:  There are no diagnoses linked to this encounter.   No follow-ups on file.  Patient was given opportunity to ask questions. Patient verbalized understanding of the plan and was able to repeat key elements of the plan. All questions were answered to their satisfaction.  [place] T  Osakis, CMA  I, Richerd ONEIDA Lemmings, CMA, have reviewed all documentation for this visit. The documentation on 02/15/24 for the exam, diagnosis, procedures, and orders are all accurate and complete.   IF YOU HAVE BEEN REFERRED TO A SPECIALIST, IT MAY TAKE 1-2 WEEKS TO SCHEDULE/PROCESS THE REFERRAL. IF YOU HAVE NOT HEARD FROM US /SPECIALIST IN TWO WEEKS, PLEASE GIVE US  A CALL AT [phone].   THE PATIENT IS ENCOURAGED TO PRACTICE SOCIAL DISTANCING DUE TO THE COVID-19 PANDEMIC.

## 2024-02-17 ENCOUNTER — Encounter: Payer: Self-pay | Admitting: Internal Medicine

## 2024-02-17 ENCOUNTER — Ambulatory Visit: Payer: Self-pay | Admitting: Internal Medicine

## 2024-02-17 DIAGNOSIS — K219 Gastro-esophageal reflux disease without esophagitis: Secondary | ICD-10-CM | POA: Insufficient documentation

## 2024-02-17 DIAGNOSIS — J189 Pneumonia, unspecified organism: Secondary | ICD-10-CM | POA: Insufficient documentation

## 2024-02-17 DIAGNOSIS — K2900 Acute gastritis without bleeding: Secondary | ICD-10-CM | POA: Insufficient documentation

## 2024-02-17 DIAGNOSIS — E876 Hypokalemia: Secondary | ICD-10-CM | POA: Insufficient documentation

## 2024-02-17 NOTE — Assessment & Plan Note (Signed)
 Recent episode due to gastroenteritis with severe dehydration. Potassium levels need re-evaluation. - Recheck potassium and kidney function with blood work. - Encourage hydration with water and hydrating foods like watermelonRecent episode due to gastroenteritis with severe dehydration. Potassium levels need re-evaluation. - Recheck potassium and kidney function with blood work. - Encourage hydration with water and hydrating foods like watermelon

## 2024-02-17 NOTE — Assessment & Plan Note (Signed)
 Recent diagnosis with symptoms of cough and fever. Currently on Augmentin  for treatment.

## 2024-02-17 NOTE — Assessment & Plan Note (Signed)
 She is encouraged to strive for BMI less than 30 to decrease cardiac risk. Advised to aim for at least 150 minutes of exercise per week.

## 2024-02-17 NOTE — Assessment & Plan Note (Signed)
 Chronic, fair control. Goal BP<130/80.  She will continue with losartan /hydrochlorothiazide  50/12.5mg  daily.  - Follow low sodium diet

## 2024-02-17 NOTE — Assessment & Plan Note (Signed)
 GERD with dysgeusia, including a persistent metallic taste. Symptoms may be exacerbated by recent gastroenteritis and dietary changes. - Instruct to take Protonix  (pantoprazole ) twice daily before breakfast and dinner. - Advise to avoid eating 3 hours before lying down. - Recommend avoiding acidic foods and beverages, such as pineapple juice and tomatoes. - Suggest trying Pepcid  for a week to see if symptoms improve.

## 2024-02-17 NOTE — Assessment & Plan Note (Signed)
 Recent symptoms and black stools suggest possible upper GI bleed. - Refer to gastroenterologist at [facility] for further evaluation. - Provide stool cards for home testing to check for blood in stool. - Instruct to take Protonix  (pantoprazole ) twice daily before breakfast and dinner. - Advise to avoid NSAIDs and vitamin C until stool testing is complete. - Instruct to follow a bland diet and avoid roughage until symptoms improve

## 2024-02-24 ENCOUNTER — Ambulatory Visit: Payer: Self-pay

## 2024-03-09 ENCOUNTER — Ambulatory Visit

## 2024-03-09 VITALS — BP 124/80 | HR 78 | Temp 98.1°F | Ht 63.0 in | Wt 191.0 lb

## 2024-03-09 DIAGNOSIS — Z23 Encounter for immunization: Secondary | ICD-10-CM | POA: Diagnosis not present

## 2024-03-09 NOTE — Progress Notes (Signed)
 Patient is in office today for a nurse visit for  covid Immunization. Patient Injection was given in the  Right deltoid. Patient tolerated injection well.

## 2024-03-23 ENCOUNTER — Ambulatory Visit: Payer: Self-pay

## 2024-06-27 ENCOUNTER — Ambulatory Visit: Payer: Self-pay | Admitting: Internal Medicine

## 2024-12-20 ENCOUNTER — Encounter: Payer: Self-pay | Admitting: Internal Medicine
